# Patient Record
Sex: Male | Born: 1970 | Race: White | Hispanic: No | Marital: Single | Smoking: Never smoker
Health system: Southern US, Community
[De-identification: ages and names within clinical notes are randomized; demographics above are authoritative.]

## PROBLEM LIST (undated history)

## (undated) DIAGNOSIS — K449 Diaphragmatic hernia without obstruction or gangrene: Secondary | ICD-10-CM

## (undated) DIAGNOSIS — F419 Anxiety disorder, unspecified: Secondary | ICD-10-CM

## (undated) DIAGNOSIS — N200 Calculus of kidney: Secondary | ICD-10-CM

## (undated) DIAGNOSIS — K219 Gastro-esophageal reflux disease without esophagitis: Secondary | ICD-10-CM

## (undated) HISTORY — DX: Gastro-esophageal reflux disease without esophagitis: K21.9

## (undated) HISTORY — DX: Anxiety disorder, unspecified: F41.9

## (undated) HISTORY — DX: Calculus of kidney: N20.0

## (undated) HISTORY — DX: Diaphragmatic hernia without obstruction or gangrene: K44.9

---

## 2000-11-19 ENCOUNTER — Emergency Department (HOSPITAL_COMMUNITY): Admission: EM | Admit: 2000-11-19 | Discharge: 2000-11-20 | Payer: Self-pay | Admitting: *Deleted

## 2001-05-05 ENCOUNTER — Encounter: Payer: Self-pay | Admitting: Emergency Medicine

## 2001-05-05 ENCOUNTER — Emergency Department (HOSPITAL_COMMUNITY): Admission: EM | Admit: 2001-05-05 | Discharge: 2001-05-05 | Payer: Self-pay | Admitting: Emergency Medicine

## 2001-05-07 ENCOUNTER — Ambulatory Visit (HOSPITAL_COMMUNITY): Admission: RE | Admit: 2001-05-07 | Discharge: 2001-05-07 | Payer: Self-pay | Admitting: Family Medicine

## 2005-02-16 ENCOUNTER — Emergency Department (HOSPITAL_COMMUNITY): Admission: EM | Admit: 2005-02-16 | Discharge: 2005-02-16 | Payer: Self-pay | Admitting: Family Medicine

## 2005-05-02 ENCOUNTER — Emergency Department (HOSPITAL_COMMUNITY): Admission: EM | Admit: 2005-05-02 | Discharge: 2005-05-02 | Payer: Self-pay | Admitting: Family Medicine

## 2005-11-15 ENCOUNTER — Emergency Department (HOSPITAL_COMMUNITY): Admission: EM | Admit: 2005-11-15 | Discharge: 2005-11-15 | Payer: Self-pay | Admitting: Emergency Medicine

## 2006-06-01 ENCOUNTER — Ambulatory Visit (HOSPITAL_COMMUNITY): Admission: RE | Admit: 2006-06-01 | Discharge: 2006-06-01 | Payer: Self-pay | Admitting: Urology

## 2007-08-21 ENCOUNTER — Emergency Department (HOSPITAL_COMMUNITY): Admission: EM | Admit: 2007-08-21 | Discharge: 2007-08-21 | Payer: Self-pay | Admitting: Emergency Medicine

## 2007-09-05 ENCOUNTER — Emergency Department (HOSPITAL_COMMUNITY): Admission: EM | Admit: 2007-09-05 | Discharge: 2007-09-05 | Payer: Self-pay | Admitting: Family Medicine

## 2008-11-09 IMAGING — CR DG ABDOMEN 1V
1 series · 1 of 1 positions shown · non-contrast
Comparison: None.

CLINICAL DATA: Pre-op, left-sided pain, left kidney stone.  
 ABDOMEN ? 1 VIEW:

[t abdomen supine]
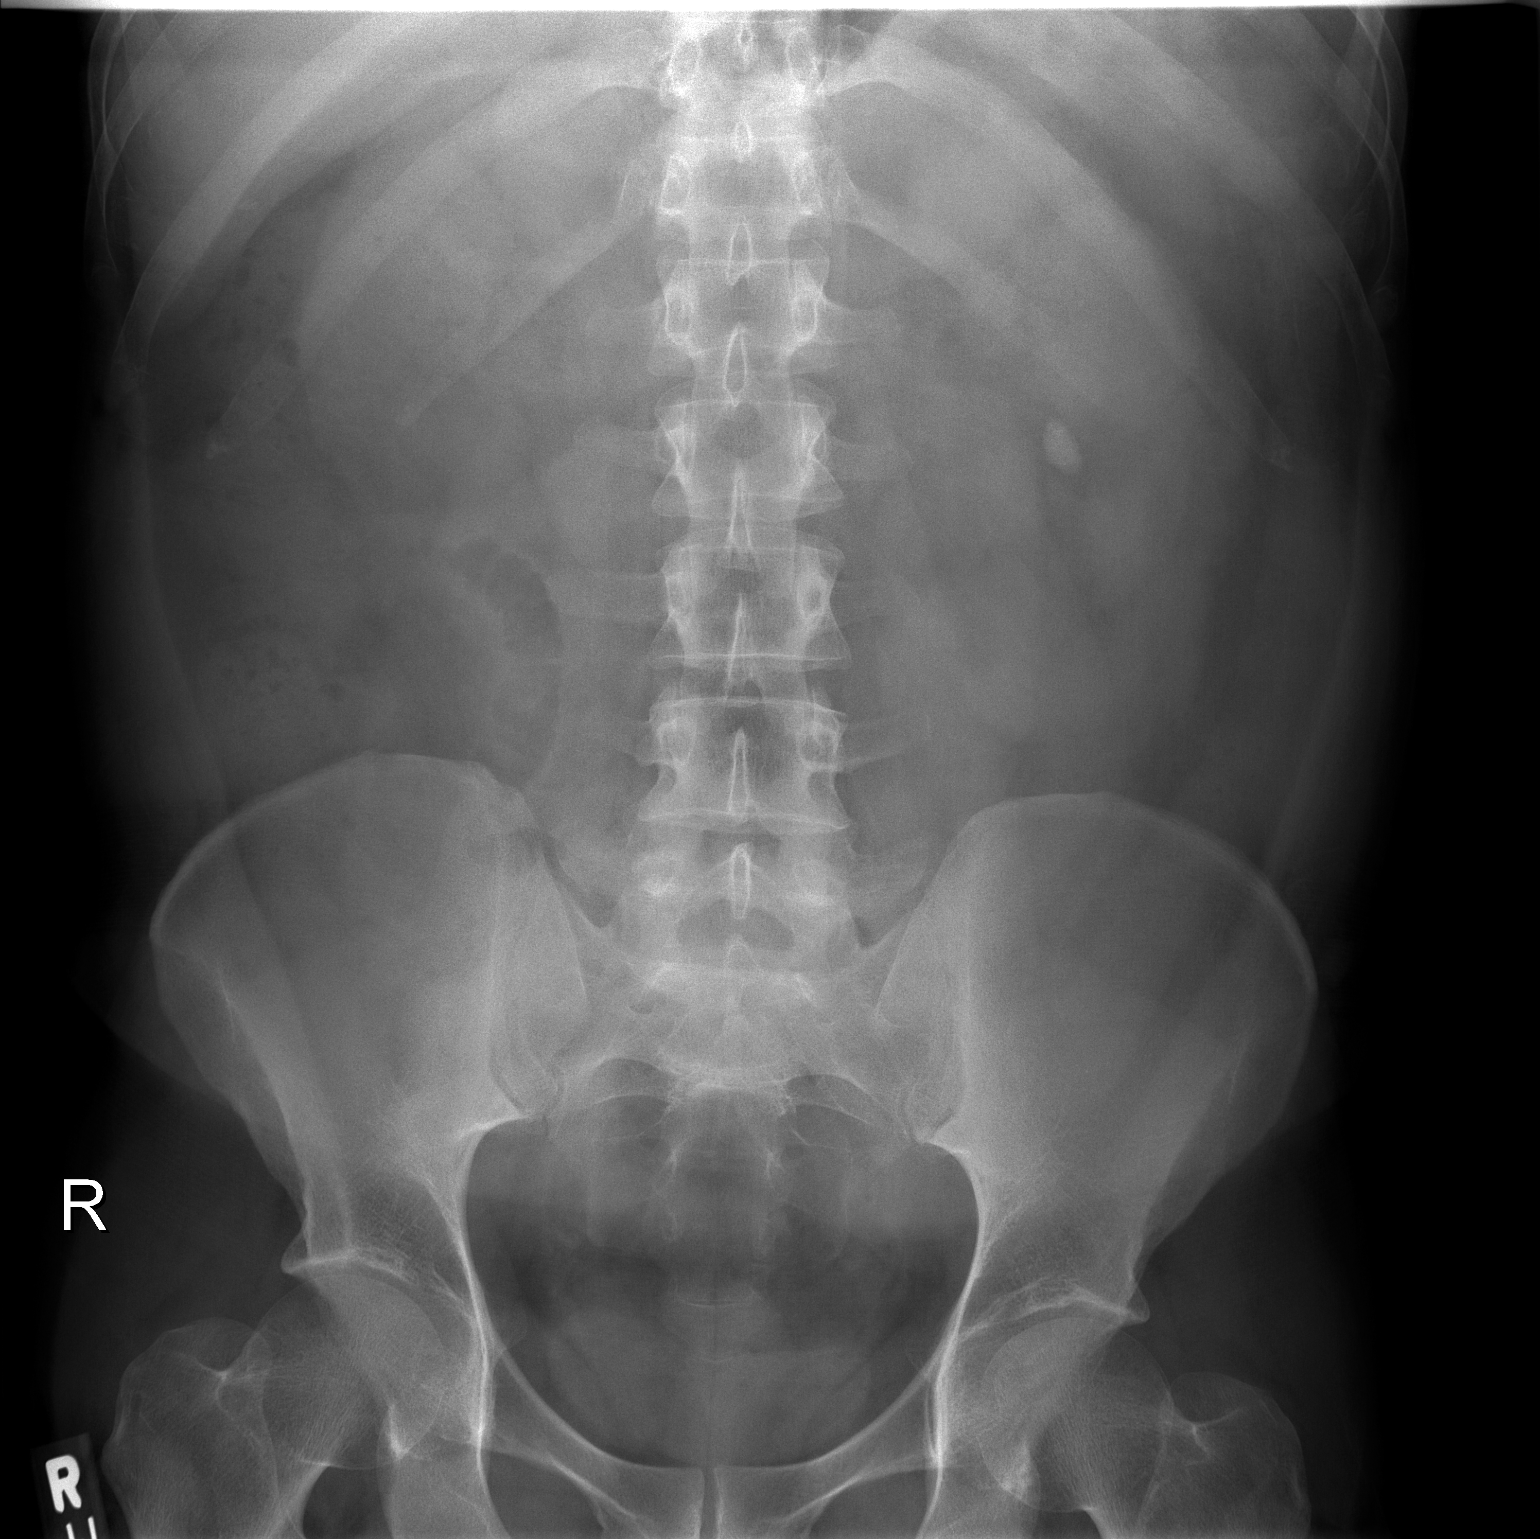

[1 of 1 positions shown; findings below may reference images not displayed]

FINDINGS: 17 x 9 mm mid to lower pole left renal calculus is seen.  Remaining abdominal radiograph appears normal.  No other urinary tract calcifications are noted specifically.
IMPRESSION: 1.  17 x 9 mm mid to lower pole left renal calculus.  
 2.  Otherwise negative.

## 2010-12-09 ENCOUNTER — Ambulatory Visit (HOSPITAL_COMMUNITY)
Admission: RE | Admit: 2010-12-09 | Discharge: 2010-12-09 | Disposition: A | Discharge status: Home / Self Care | Payer: 59 | Source: Ambulatory Visit | Attending: Urology | Admitting: Urology

## 2010-12-09 DIAGNOSIS — E7800 Pure hypercholesterolemia, unspecified: Secondary | ICD-10-CM | POA: Insufficient documentation

## 2010-12-09 DIAGNOSIS — N201 Calculus of ureter: Secondary | ICD-10-CM | POA: Insufficient documentation

## 2010-12-09 DIAGNOSIS — K219 Gastro-esophageal reflux disease without esophagitis: Secondary | ICD-10-CM | POA: Insufficient documentation

## 2010-12-09 LAB — CBC
HCT: 42.9 % (ref 39.0–52.0)
Hemoglobin: 15.1 g/dL (ref 13.0–17.0)
MCH: 31.5 pg (ref 26.0–34.0)
MCHC: 35.2 g/dL (ref 30.0–36.0)
MCV: 89.6 fL (ref 78.0–100.0)
Platelets: 256 10*3/uL (ref 150–400)
RBC: 4.79 MIL/uL (ref 4.22–5.81)
RDW: 12.1 % (ref 11.5–15.5)
WBC: 9.2 10*3/uL (ref 4.0–10.5)

## 2010-12-09 LAB — SURGICAL PCR SCREEN
MRSA, PCR: NEGATIVE
Staphylococcus aureus: NEGATIVE

## 2010-12-15 NOTE — Op Note (Signed)
NAME:  Adam [name], Adam [name]                  ACCOUNT NO.:  [id number]  MEDICAL RECORD NO.:  [id number]  LOCATION:  DAYL                         FACILITY:  [facility] [place]  PHYSICIAN:  Excell Seltzer. Annabell Howells, M.D.    DATE OF BIRTH:  June 15, 1970  DATE OF PROCEDURE:  12/09/2010 DATE OF DISCHARGE:  12/09/2010                              OPERATIVE REPORT   PROCEDURE:  Cystoscopy, right retrograde pyelogram with interpretation, and right ureteroscopic stone extraction.  PREOPERATIVE DIAGNOSIS:  Right distal stone.  POSTOPERATIVE DIAGNOSIS:  Right distal stone.  SURGEON:  Excell Seltzer. Annabell Howells, MD  ANESTHESIA:  General.  SPECIMEN:  Stone fragments.  DRAINS:  None.  COMPLICATIONS:  None.  INDICATIONS:  [name] is a 40 year old white male with a long history of urolithiasis.  He has a 6 mm right distal stone with persistent symptoms.  He has elected ureteroscopy.  FINDINGS OF PROCEDURE:  He was taken to the operating room where he was given Cipro.  A general anesthetic was induced.  He was placed in lithotomy position.  His perineum and genitalia were prepped with Betadine solution.  He was draped in the usual sterile fashion. Cystoscopy was performed using a 22-French scope and 12-degree lens. Examination revealed a normal urethra.  The external sphincter was intact.  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  Examination of the bladder revealed smooth wall without tumor, stones or inflammation.  Ureteral orifices were unremarkable.  The guidewire was passed up the right ureteral orifice and bypassed the stone, which was visible on fluoro in the distal ureter without difficulty.  The cystoscope was removed and a 12-French introducer sheath and a core dilator was passed over the wire to the mid ureter without difficulty. I could feel the stone right against the dilator.  The dilator was removed and a 6-French short ureteroscope was inserted. Upon insertion of the ureteroscope,  stone fragments were noted at the ureteral meatus which effluxed into the bladder.  I then advanced the scope up the ureter but was unable to see any additional stones.  However, the fragment that effluxed did not appear to be consistent with the size noted on CT scan.  I then reinserted the cystoscope alongside the wire and passed a 5- [place] open-end catheter along the wire and instilled contrast.  This demonstrated some filling defects in the upper distal and mid ureter consistent with stone fragments.  Ureteroscope was then reinserted and stone fragments were identified. Three additional stone fragments were then removed with a nitinol basket without difficulty.  At this point, contrast was reinstilled through the ureteroscope with no additional filling defects noted.  The ureteroscope was removed.  The cystoscope was reinserted over the wire.  The wire was removed.  I did not feel a stent was indicated.  The bladder was drained.  The stone fragments were retrieved.  The patient was given a B and O suppository.  He was taken down from lithotomy position.  He was given IV Tylenol and his anesthetic was reversed.  He was moved to the recovery room in stable condition.  There were no evident complications.     Excell Seltzer. Annabell Howells, M.D.  JJW/MEDQ  D:  12/09/2010  T:  12/09/2010  Job:  [id number]  Electronically Signed by Bjorn Pippin M.D. on 12/15/2010 01:24:46 PM

## 2010-12-23 ENCOUNTER — Ambulatory Visit (HOSPITAL_BASED_OUTPATIENT_CLINIC_OR_DEPARTMENT_OTHER)
Admission: RE | Admit: 2010-12-23 | Discharge: 2010-12-23 | Disposition: A | Discharge status: Home / Self Care | Payer: 59 | Source: Ambulatory Visit | Attending: Urology | Admitting: Urology

## 2010-12-23 ENCOUNTER — Ambulatory Visit (HOSPITAL_COMMUNITY)
Admission: RE | Admit: 2010-12-23 | Discharge: 2010-12-23 | Disposition: A | Discharge status: Home / Self Care | Payer: 59 | Source: Ambulatory Visit | Attending: Urology | Admitting: Urology

## 2010-12-23 DIAGNOSIS — Z79899 Other long term (current) drug therapy: Secondary | ICD-10-CM | POA: Insufficient documentation

## 2010-12-23 DIAGNOSIS — Z01818 Encounter for other preprocedural examination: Secondary | ICD-10-CM | POA: Insufficient documentation

## 2010-12-23 DIAGNOSIS — N201 Calculus of ureter: Secondary | ICD-10-CM | POA: Insufficient documentation

## 2010-12-23 DIAGNOSIS — E7800 Pure hypercholesterolemia, unspecified: Secondary | ICD-10-CM | POA: Insufficient documentation

## 2010-12-30 ENCOUNTER — Ambulatory Visit (HOSPITAL_COMMUNITY): Admission: RE | Admit: 2010-12-30 | Payer: 59 | Source: Ambulatory Visit

## 2011-01-03 ENCOUNTER — Ambulatory Visit (HOSPITAL_COMMUNITY)
Admission: RE | Admit: 2011-01-03 | Discharge: 2011-01-03 | Disposition: A | Discharge status: Home / Self Care | Payer: 59 | Source: Ambulatory Visit | Attending: Urology | Admitting: Urology

## 2011-01-03 DIAGNOSIS — N201 Calculus of ureter: Secondary | ICD-10-CM | POA: Insufficient documentation

## 2011-01-03 DIAGNOSIS — E7800 Pure hypercholesterolemia, unspecified: Secondary | ICD-10-CM | POA: Insufficient documentation

## 2011-01-05 LAB — POCT RAPID STREP A
Streptococcus, Group A Screen (Direct): POSITIVE — AB
Streptococcus, Group A Screen (Direct): NEGATIVE

## 2011-01-13 NOTE — Op Note (Addendum)
  NAME:  Adam [name], Adam [name]                  ACCOUNT NO.:  [id number]  MEDICAL RECORD NO.:  [id number]  LOCATION:                               FACILITY:  [facility]  PHYSICIAN:  Excell Seltzer. Annabell Howells, M.D.    DATE OF BIRTH:  Aug 26, 1970  DATE OF PROCEDURE: DATE OF DISCHARGE:                              OPERATIVE REPORT   PROCEDURE:  Left ureteroscopic stone extraction.  PREOPERATIVE DIAGNOSIS:  Left ureterovesical junction stone.  POSTOPERATIVE DIAGNOSIS:  Left ureterovesical junction stone.  SURGEON:  Excell Seltzer. Annabell Howells, M.D.  ANESTHESIA:  General.  SPECIMENS:  None.  DRAINS:  None.  COMPLICATIONS:  None.  INDICATIONS:  [name] is a 40 year old white male with recurrent urolithiasis, has 3.5 X 4.5 mm left distal ureteral stone with symptoms. He has elected ureteroscopy.  FINDINGS OF PROCEDURE:  He was taken to the operating room where general anesthetic was induced.  He was placed in lithotomy position.  His perineum and genitalia were prepped with Betadine solution.  He was draped in usual sterile fashion.  He had been given Cipro preoperatively.  Ureteroscopy was performed using the 6-French short ureteroscope. Examination revealed a normal urethra.  The prostate was short without obstruction.  The bladder had been inspected a couple of weeks ago. Cystoscopy, the left ureteral orifice was identified.  I was able to pass the scope up the orifice without difficulty to the level of the stone.  The stone was pushed a bit proximally, allowing the ureteroscope to dilate the ureter, and a nitinol basket was then passed through the ureteroscope.  The stone was grasped, was removed without difficulty.  A red rubber catheter was placed and the bladder was drained.  A B and O suppository was placed.  The patient was taken down from lithotomy position.  His anesthetic was reversed.  He was moved to recovery room in stable condition.  There were no complications.  He has left lower pole renal stones and  has almost scheduled for lithotripsy for the stones a week after next.     Excell Seltzer. Annabell Howells, M.D.     JJW/MEDQ  D:  12/23/2010  T:  12/23/2010  Job:  [id number]  Electronically Signed by Bjorn Pippin M.D. on 01/14/2011 01:02:34 PM

## 2013-06-13 IMAGING — CR DG ABDOMEN 1V
1 series · 1 of 1 positions shown · non-contrast
Comparison: 12/23/2010

CLINICAL DATA: Lithotripsy

ABDOMEN - 1 VIEW

[t abdomen supine]
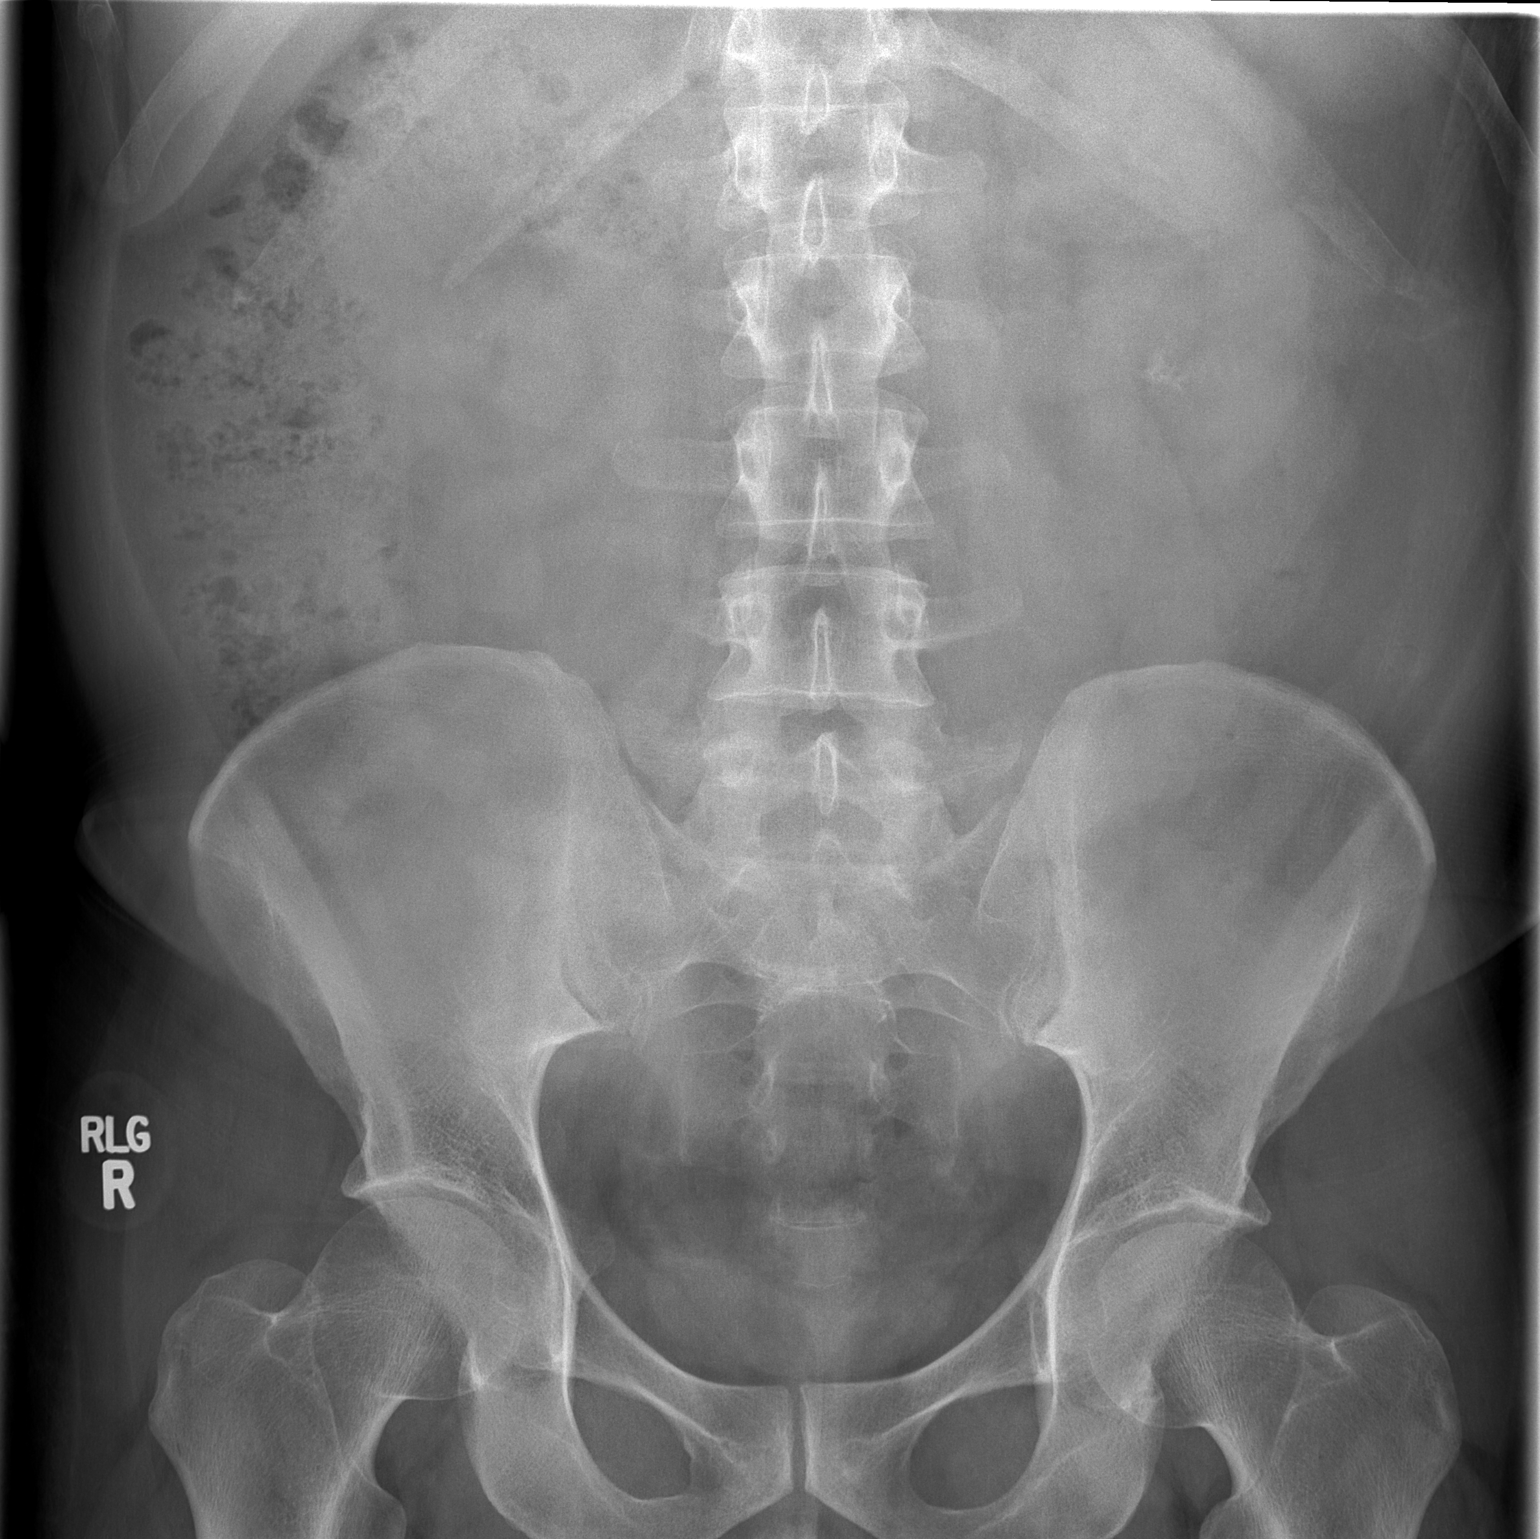

[1 of 1 positions shown; findings below may reference images not displayed]

FINDINGS: Bilateral nephrolithiasis is stable.  Stool in the
ascending and proximal transverse colon is stable.  No
disproportionate dilatation of bowel.  Unremarkable bony framework.
IMPRESSION: Bilateral nephrolithiasis.

## 2019-05-28 ENCOUNTER — Encounter: Payer: Self-pay | Admitting: Family Medicine

## 2019-05-28 ENCOUNTER — Ambulatory Visit: Payer: Self-pay | Admitting: Family Medicine

## 2019-05-28 ENCOUNTER — Other Ambulatory Visit: Payer: Self-pay | Admitting: Family Medicine

## 2019-05-28 VITALS — BP 142/96 | HR 98 | Ht 67.5 in | Wt 188.0 lb

## 2019-05-28 DIAGNOSIS — Z789 Other specified health status: Secondary | ICD-10-CM

## 2019-05-28 NOTE — Progress Notes (Signed)
Subjective:     Patient ID: Adam [name], male   DOB: [birth year], 49 y.o.   MRN: [id number]  HPI [name] for his required wellness visit for his insurance. His PCP is Dr. Marisue Humble, last seen 3 years ago. He has a hx of hiatal hernia and small ulcers, white coat syndrome, kidney stones, anxiety, and GERD. Family hx of right sided colon cancer. He had colonoscopy and EGD at age 25. Sees Dr. Watt Climes. Needs to schedule repeat for f/u. He reports hx of being on a statin, but was d/c'd d/t elevated liver enzymes. He reports his home BP is usually 110-120/70-80.   Past Medical History:  Diagnosis Date  . Anxiety   . GERD (gastroesophageal reflux disease)   . Hiatal hernia   . Kidney stones    Allergies  Allergen Reactions  . Ivp Dye [Iodinated Diagnostic Agents] Hives and Swelling    Current Outpatient Medications:  .  loperamide (IMODIUM) 2 MG capsule, Take by mouth as needed for diarrhea or loose stools., Disp: , Rfl:  .  loratadine (CLARITIN) 10 MG tablet, Take 10 mg by mouth daily., Disp: , Rfl:  .  naproxen sodium (ALEVE) 220 MG tablet, Take 220 mg by mouth daily as needed., Disp: , Rfl:  .  omeprazole (PRILOSEC) 20 MG capsule, Take 20 mg by mouth daily., Disp: , Rfl:    Review of Systems  Constitutional: Negative for chills, fatigue, fever and unexpected weight change.  HENT: Negative for congestion, ear pain, sinus pressure, sinus pain and sore throat.   Eyes: Negative for discharge and visual disturbance.  Respiratory: Negative for cough, shortness of breath and wheezing.   Cardiovascular: Negative for chest pain and leg swelling.  Gastrointestinal: Negative for abdominal pain, blood in stool, constipation, diarrhea, nausea and vomiting.  Genitourinary: Negative for difficulty urinating and hematuria.  Skin: Negative for color change.  Neurological: Negative for dizziness, weakness, light-headedness and headaches.  Hematological: Negative  for adenopathy.  All other systems reviewed and are negative.      Objective:   Physical Exam Vitals reviewed.  Constitutional:      General: He is not in acute distress.    Appearance: He is well-developed.  HENT:     Head: Normocephalic and atraumatic.  Cardiovascular:     Rate and Rhythm: Normal rate and regular rhythm.     Heart sounds: Normal heart sounds. No murmur.  Pulmonary:     Effort: Pulmonary effort is normal. No respiratory distress.     Breath sounds: Normal breath sounds.  Musculoskeletal:     Cervical back: Neck supple.  Skin:    General: Skin is warm and dry.  Neurological:     Mental Status: He is alert and oriented to person, place, and time.  Psychiatric:        Mood and Affect: Mood normal.        Behavior: Behavior normal.    Today's Vitals   05/28/19 1604  BP: (!) 142/96  Pulse: 98  SpO2: 98%  Weight: 188 lb (85.3 kg)  Height: 5' 7.5" (1.715 m)   Body mass index is 29.01 kg/m.     Assessment:     Participant in health and wellness plan      Plan:     1. Encouraged f/u with PCP for annual physical. 2. Lab work drawn, will check lipid panel, cmp, and psa. Will notify pt of results.  3. Encouraged healthy diet and exercise.  4. F/u here prn.

## 2019-05-29 LAB — COMPLETE METABOLIC PANEL WITH GFR
AG Ratio: 1.3 (calc) (ref 1.0–2.5)
ALT: 39 U/L (ref 9–46)
AST: 32 U/L (ref 10–35)
Albumin: 4.3 g/dL (ref 3.6–5.1)
Alkaline phosphatase (APISO): 71 U/L (ref 35–144)
BUN: 15 mg/dL (ref 7–25)
CO2: 26 mmol/L (ref 20–32)
Calcium: 9.4 mg/dL (ref 8.6–10.3)
Chloride: 104 mmol/L (ref 98–110)
Creat: 0.84 mg/dL (ref 0.70–1.33)
GFR, Est African American: 118 mL/min/{1.73_m2} (ref >=60)
GFR, Est Non African American: 102 mL/min/{1.73_m2} (ref >=60)
Globulin: 3.2 g/dL (calc) (ref 1.9–3.7)
Glucose, Bld: 121 mg/dL (ref 65–139)
Potassium: 4.4 mmol/L (ref 3.5–5.3)
Sodium: 140 mmol/L (ref 135–146)
Total Bilirubin: 0.5 mg/dL (ref 0.2–1.2)
Total Protein: 7.5 g/dL (ref 6.1–8.1)

## 2019-05-29 LAB — LIPID PANEL
Cholesterol: 240 mg/dL — ABNORMAL HIGH (ref <200)
HDL: 42 mg/dL (ref >=40)
LDL Cholesterol (Calc): 151 mg/dL (calc) — ABNORMAL HIGH
Non-HDL Cholesterol (Calc): 198 mg/dL (calc) — ABNORMAL HIGH (ref <130)
Total CHOL/HDL Ratio: 5.7 (calc) — ABNORMAL HIGH (ref <5.0)
Triglycerides: 308 mg/dL — ABNORMAL HIGH (ref <150)

## 2019-05-29 LAB — PSA: PSA: 1 ng/mL (ref <=4.0)

## 2021-05-03 ENCOUNTER — Telehealth: Payer: Self-pay

## 2021-05-03 NOTE — Telephone Encounter (Signed)
Called pt, LVM to schedule NP OV.

## 2021-05-03 NOTE — Telephone Encounter (Signed)
-----   Message from Etta Grandchild, MD sent at 05/03/2021  7:38 AM EST ----- Regarding: CPX Maleeka Sabatino -  Will call him and get him on my schedule for a CPX? Friday if needed.  TH

## 2021-06-02 ENCOUNTER — Encounter: Payer: Self-pay | Admitting: Internal Medicine

## 2021-06-02 ENCOUNTER — Other Ambulatory Visit: Payer: Self-pay

## 2021-06-02 ENCOUNTER — Ambulatory Visit (INDEPENDENT_AMBULATORY_CARE_PROVIDER_SITE_OTHER): Payer: No Typology Code available for payment source | Admitting: Internal Medicine

## 2021-06-02 VITALS — BP 156/96 | HR 96 | Temp 98.2°F | Ht 67.5 in | Wt 196.0 lb

## 2021-06-02 DIAGNOSIS — N200 Calculus of kidney: Secondary | ICD-10-CM | POA: Diagnosis not present

## 2021-06-02 DIAGNOSIS — R0683 Snoring: Secondary | ICD-10-CM | POA: Diagnosis not present

## 2021-06-02 DIAGNOSIS — I10 Essential (primary) hypertension: Secondary | ICD-10-CM | POA: Diagnosis not present

## 2021-06-02 DIAGNOSIS — Z1159 Encounter for screening for other viral diseases: Secondary | ICD-10-CM | POA: Insufficient documentation

## 2021-06-02 DIAGNOSIS — Z23 Encounter for immunization: Secondary | ICD-10-CM

## 2021-06-02 DIAGNOSIS — Z0000 Encounter for general adult medical examination without abnormal findings: Secondary | ICD-10-CM

## 2021-06-02 DIAGNOSIS — R3121 Asymptomatic microscopic hematuria: Secondary | ICD-10-CM

## 2021-06-02 DIAGNOSIS — Z1211 Encounter for screening for malignant neoplasm of colon: Secondary | ICD-10-CM | POA: Insufficient documentation

## 2021-06-02 DIAGNOSIS — Z0001 Encounter for general adult medical examination with abnormal findings: Secondary | ICD-10-CM

## 2021-06-02 DIAGNOSIS — K580 Irritable bowel syndrome with diarrhea: Secondary | ICD-10-CM

## 2021-06-02 LAB — BASIC METABOLIC PANEL
BUN: 15 mg/dL (ref 6–23)
CO2: 33 mEq/L — ABNORMAL HIGH (ref 19–32)
Calcium: 9 mg/dL (ref 8.4–10.5)
Chloride: 105 mEq/L (ref 96–112)
Creatinine, Ser: 1.11 mg/dL (ref 0.40–1.50)
GFR: 76.11 mL/min (ref >60.00)
Glucose, Bld: 100 mg/dL — ABNORMAL HIGH (ref 70–99)
Potassium: 3.7 mEq/L (ref 3.5–5.1)
Sodium: 142 mEq/L (ref 135–145)

## 2021-06-02 LAB — HEPATIC FUNCTION PANEL
ALT: 25 U/L (ref 0–53)
AST: 20 U/L (ref 0–37)
Albumin: 4.3 g/dL (ref 3.5–5.2)
Alkaline Phosphatase: 67 U/L (ref 39–117)
Bilirubin, Direct: 0.1 mg/dL (ref 0.0–0.3)
Total Bilirubin: 0.4 mg/dL (ref 0.2–1.2)
Total Protein: 7.1 g/dL (ref 6.0–8.3)

## 2021-06-02 LAB — LIPID PANEL
Cholesterol: 225 mg/dL — ABNORMAL HIGH (ref 0–200)
HDL: 42.4 mg/dL (ref >39.00)
NonHDL: 182.85
Total CHOL/HDL Ratio: 5
Triglycerides: 335 mg/dL — ABNORMAL HIGH (ref 0.0–149.0)
VLDL: 67 mg/dL — ABNORMAL HIGH (ref 0.0–40.0)

## 2021-06-02 LAB — TSH: TSH: 1.64 u[IU]/mL (ref 0.35–5.50)

## 2021-06-02 LAB — CBC WITH DIFFERENTIAL/PLATELET
Basophils Absolute: 0 10*3/uL (ref 0.0–0.1)
Basophils Relative: 0.8 % (ref 0.0–3.0)
Eosinophils Absolute: 0.1 10*3/uL (ref 0.0–0.7)
Eosinophils Relative: 2.3 % (ref 0.0–5.0)
HCT: 41.3 % (ref 39.0–52.0)
Hemoglobin: 14 g/dL (ref 13.0–17.0)
Lymphocytes Relative: 31.5 % (ref 12.0–46.0)
Lymphs Abs: 2 10*3/uL (ref 0.7–4.0)
MCHC: 33.9 g/dL (ref 30.0–36.0)
MCV: 88.5 fl (ref 78.0–100.0)
Monocytes Absolute: 0.5 10*3/uL (ref 0.1–1.0)
Monocytes Relative: 8.4 % (ref 3.0–12.0)
Neutro Abs: 3.7 10*3/uL (ref 1.4–7.7)
Neutrophils Relative %: 57 % (ref 43.0–77.0)
Platelets: 218 10*3/uL (ref 150.0–400.0)
RBC: 4.67 Mil/uL (ref 4.22–5.81)
RDW: 13.1 % (ref 11.5–15.5)
WBC: 6.5 10*3/uL (ref 4.0–10.5)

## 2021-06-02 LAB — LDL CHOLESTEROL, DIRECT: Direct LDL: 150 mg/dL

## 2021-06-02 LAB — PSA: PSA: 0.79 ng/mL (ref 0.10–4.00)

## 2021-06-02 NOTE — Patient Instructions (Signed)

## 2021-06-02 NOTE — Progress Notes (Signed)
Subjective:  Patient ID: Adam [name], male    DOB: 04/07/1971  Age: 51 y.o. MRN: [id number]  CC: Annual Exam and Hypertension  This visit occurred during the SARS-CoV-2 public health emergency.  Safety protocols were in place, including screening questions prior to the visit, additional usage of staff PPE, and extensive cleaning of exam room while observing appropriate contact time as indicated for disinfecting solutions.    HPI Adam [name] presents for a CPX and to establish.  He has untreated hypertension.  He complains of intermittent headaches but he denies blurred vision, chest pain, shortness of breath, dyspnea on exertion, edema, or fatigue.  He has difficulty falling asleep, nocturia, and has had complaints about loud snoring.  He has chronic diarrhea caused by IBS-D.  He denies abdominal pain, loss of appetite, weight loss, melena, or bright red blood per rectum.  History Adam [name] has a past medical history of Anxiety, GERD (gastroesophageal reflux disease), Hiatal hernia, and Kidney stones.   He has no past surgical history on file.   His family history includes Hypertension in his father.He reports that he has never smoked. He has never used smokeless tobacco. He reports current alcohol use of about 3.0 standard drinks per week. He reports that he does not use drugs.  Outpatient Medications Prior to Visit  Medication Sig Dispense Refill   loperamide (IMODIUM) 2 MG capsule Take by mouth as needed for diarrhea or loose stools.     loratadine (CLARITIN) 10 MG tablet Take 10 mg by mouth daily.     naproxen sodium (ALEVE) 220 MG tablet Take 220 mg by mouth daily as needed.     omeprazole (PRILOSEC) 20 MG capsule Take 20 mg by mouth daily.     No facility-administered medications prior to visit.    ROS Review of Systems  Constitutional:  Positive for unexpected weight change. Negative for appetite change, chills, diaphoresis and fatigue.  HENT: Negative.    Eyes: Negative.    Respiratory:  Negative for cough, chest tightness, shortness of breath and wheezing.   Cardiovascular:  Negative for chest pain, palpitations and leg swelling.  Gastrointestinal:  Positive for diarrhea. Negative for abdominal pain, anal bleeding, blood in stool, constipation, nausea, rectal pain and vomiting.  Endocrine: Negative.   Genitourinary: Negative.  Negative for difficulty urinating, dysuria, penile discharge, scrotal swelling and testicular pain.  Musculoskeletal: Negative.   Skin: Negative.  Negative for color change.  Neurological:  Positive for headaches. Negative for dizziness, tremors, seizures, syncope, weakness and light-headedness.  Hematological:  Negative for adenopathy. Does not bruise/bleed easily.  Psychiatric/Behavioral: Negative.     Objective:  BP (!) 156/96    Pulse 96    Temp 98.2 F (36.8 C) (Oral)    Ht 5' 7.5" (1.715 m)    Wt 196 lb (88.9 kg)    SpO2 97%    BMI 30.24 kg/m   Physical Exam Vitals reviewed.  Constitutional:      Appearance: Normal appearance.  HENT:     Nose: Nose normal.     Mouth/Throat:     Mouth: Mucous membranes are moist.  Eyes:     General: No scleral icterus.    Conjunctiva/sclera: Conjunctivae normal.  Cardiovascular:     Rate and Rhythm: Normal rate and regular rhythm.     Heart sounds: Normal heart sounds, S1 normal and S2 normal. No murmur heard.   No gallop.     Comments: EKG- NSR, 83 bpm No LVH Normal EKG Pulmonary:  Effort: Pulmonary effort is normal.     Breath sounds: No stridor. No wheezing, rhonchi or rales.  Abdominal:     General: Abdomen is protuberant. Bowel sounds are normal. There is no distension.     Palpations: There is no hepatomegaly, splenomegaly or mass.     Tenderness: There is no abdominal tenderness. There is no guarding.     Hernia: There is no hernia in the left inguinal area or right inguinal area.  Genitourinary:    Pubic Area: No rash.      Penis: Normal and circumcised.       Testes: Normal.     Epididymis:     Right: Normal.     Left: Normal.     Prostate: Normal. Not enlarged, not tender and no nodules present.     Rectum: Normal. Guaiac result negative. No mass, tenderness, anal fissure, external hemorrhoid or internal hemorrhoid. Normal anal tone.  Musculoskeletal:        General: Normal range of motion.     Cervical back: Neck supple.     Right lower leg: No edema.     Left lower leg: No edema.  Lymphadenopathy:     Cervical: No cervical adenopathy.     Lower Body: No right inguinal adenopathy. No left inguinal adenopathy.  Skin:    General: Skin is warm and dry.  Neurological:     General: No focal deficit present.     Mental Status: He is alert. Mental status is at baseline.  Psychiatric:        Mood and Affect: Mood normal.        Behavior: Behavior normal.    Lab Results  Component Value Date   WBC 6.5 06/02/2021   HGB 14.0 06/02/2021   HCT 41.3 06/02/2021   PLT 218.0 06/02/2021   GLUCOSE 100 (H) 06/02/2021   CHOL 225 (H) 06/02/2021   TRIG 335.0 (H) 06/02/2021   HDL 42.40 06/02/2021   LDLDIRECT 150.0 06/02/2021   LDLCALC 151 (H) 05/28/2019   ALT 25 06/02/2021   AST 20 06/02/2021   NA 142 06/02/2021   K 3.7 06/02/2021   CL 105 06/02/2021   CREATININE 1.11 06/02/2021   BUN 15 06/02/2021   CO2 33 (H) 06/02/2021   TSH 1.64 06/02/2021   PSA 0.79 06/02/2021     Assessment & Plan:   Adam [name] was seen today for annual exam and hypertension.  Diagnoses and all orders for this visit:  Screen for colon cancer -     Ambulatory referral to Gastroenterology  Encounter for general adult medical examination with abnormal findings-exam completed, labs reviewed; statin therapy is not indicated, vaccines reviewed and updated, cancer screenings addressed, patient education was given. -     Lipid panel; Future -     PSA; Future -     Hepatitis C antibody; Future -     HIV Antibody (routine testing w rflx); Future -     HIV Antibody (routine  testing w rflx) -     Hepatitis C antibody -     PSA -     Lipid panel  Primary hypertension-his blood pressure is not adequately well controlled and he is symptomatic.  We will check labs to screen for secondary causes and endorgan damage.  His EKG is reassuring.  In addition to lifestyle modifications he will start taking an ARB. -     TSH; Future -     Urinalysis, Routine w reflex microscopic; Future -  Hepatic function panel; Future -     Basic metabolic panel; Future -     Aldosterone + renin activity w/ ratio; Future -     CBC with Differential/Platelet; Future -     EKG 12-Lead -     CBC with Differential/Platelet -     Aldosterone + renin activity w/ ratio -     Basic metabolic panel -     Hepatic function panel -     Urinalysis, Routine w reflex microscopic -     TSH -     olmesartan (BENICAR) 20 MG tablet; Take 1 tablet (20 mg total) by mouth daily. -     US Renal Artery Stenosis; Future  Need for hepatitis C screening test -     Hepatitis C antibody; Future -     Hepatitis C antibody  Need for shingles vaccine -     Varicella-zoster vaccine IM (Shingrix)  Loud snoring -     Ambulatory referral to Sleep Studies  Asymptomatic microscopic hematuria- He is not willing to undergo a renal CT scan. -     Cancel: CT RENAL STONE STUDY; Future -     US Renal Artery Stenosis; Future  Nephrolithiasis -     Cancel: CT RENAL STONE STUDY; Future -     US Renal Artery Stenosis; Future  Irritable bowel syndrome with diarrhea- He treats this with imodium.  Other orders -     LDL cholesterol, direct   I am having Leny Morozov Broom. Goldberg start on olmesartan. I am also having him maintain his naproxen sodium, loperamide, loratadine, and omeprazole.  Meds ordered this encounter  Medications   olmesartan (BENICAR) 20 MG tablet    Sig: Take 1 tablet (20 mg total) by mouth daily.    Dispense:  90 tablet    Refill:  0     Follow-up: Return in about 3 months (around  08/30/2021).  Scarlette Calico, MD

## 2021-06-03 ENCOUNTER — Encounter: Payer: Self-pay | Admitting: Internal Medicine

## 2021-06-03 DIAGNOSIS — R3121 Asymptomatic microscopic hematuria: Secondary | ICD-10-CM | POA: Insufficient documentation

## 2021-06-03 LAB — URINALYSIS, ROUTINE W REFLEX MICROSCOPIC
Bilirubin Urine: NEGATIVE
Ketones, ur: NEGATIVE
Leukocytes,Ua: NEGATIVE
Nitrite: NEGATIVE
Specific Gravity, Urine: 1.025 (ref 1.000–1.030)
Total Protein, Urine: NEGATIVE
Urine Glucose: NEGATIVE
Urobilinogen, UA: 0.2 (ref 0.0–1.0)
pH: 6 (ref 5.0–8.0)

## 2021-06-03 MED ORDER — OLMESARTAN MEDOXOMIL 20 MG PO TABS: 20.0000 mg | ORAL_TABLET | Freq: Every day | ORAL | 0 refills | Status: DC

## 2021-06-08 ENCOUNTER — Encounter: Payer: Self-pay | Admitting: Internal Medicine

## 2021-06-08 DIAGNOSIS — N200 Calculus of kidney: Secondary | ICD-10-CM | POA: Insufficient documentation

## 2021-06-08 LAB — HEPATITIS C ANTIBODY
Hepatitis C Ab: NONREACTIVE
SIGNAL TO CUT-OFF: 0.08 (ref <1.00)

## 2021-06-08 LAB — HIV ANTIBODY (ROUTINE TESTING W REFLEX): HIV 1&2 Ab, 4th Generation: NONREACTIVE

## 2021-06-08 LAB — ALDOSTERONE + RENIN ACTIVITY W/ RATIO
Aldosterone: <1 ng/dL
Renin Activity: 0.24 ng/mL/h — ABNORMAL LOW (ref 0.25–5.82)

## 2021-08-04 ENCOUNTER — Ambulatory Visit: Payer: No Typology Code available for payment source | Admitting: Neurology

## 2021-08-04 ENCOUNTER — Encounter: Payer: Self-pay | Admitting: Neurology

## 2021-08-04 VITALS — BP 131/84 | HR 96 | Ht 68.0 in | Wt 198.6 lb

## 2021-08-04 DIAGNOSIS — Z820 Family history of epilepsy and other diseases of the nervous system: Secondary | ICD-10-CM

## 2021-08-04 DIAGNOSIS — R0683 Snoring: Secondary | ICD-10-CM

## 2021-08-04 DIAGNOSIS — R519 Headache, unspecified: Secondary | ICD-10-CM | POA: Diagnosis not present

## 2021-08-04 DIAGNOSIS — G4719 Other hypersomnia: Secondary | ICD-10-CM

## 2021-08-04 NOTE — Progress Notes (Signed)
Subjective:  ?  ?Patient ID: Adam Adam [name] Adam [name] a 51 y.o. male. ? ?HPI ? ? ? ?History:  ? ?Dear Dr. Yetta [name],  ? ?I saw your patient, Adam Adam [name], upon your kind request in my sleep clinic today for initial consultation of his sleep disorder, in particular, concern for underlying obstructive sleep apnea.  The patient Adam [name] unaccompanied today.  As you know, Adam Adam [name] Adam [name] a 51 year old right-handed gentleman with an underlying medical history of reflux disease, hiatal hernia, kidney stones, anxiety, irritable bowel syndrome, hypertension and borderline obesity, who reports snoring and excessive daytime somnolence as well as recurrent headaches.  As well as difficulty initiating sleep.  I reviewed your office note from 06/02/2021.  He was started losartan and his blood pressure has improved for the most part and his recurrent headaches have actually improved quite a bit from nearly daily to about twice a week.  He does occasionally wake up with a headache.  He denies night to night nocturia.  His mom has sleep apnea and has a CPAP machine.  He would consider treatment with a CPAP if necessary.  His Epworth sleepiness score Adam [name] 12 out of 24, fatigue severity score Adam [name] 39 out of 63.  He Adam [name] single and lives alone.  He has no children.  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  He does have stress at work.  He works typically from 8:30 AM to 5 PM or thereabouts.  Bedtime Adam [name] generally around 11 PM and rise time around 7 AM.  He does drink quite a bit of caffeine in the form of soda, typically between 4 and 6 cans/day.  He Adam [name] a non-smoker and drinks alcohol occasionally, 2-3 drinks per week.  Weight has been fluctuating, generally within 10 pounds.  ? ?His Past Medical History Adam [name] Significant For: ?Past Medical History:  ?Diagnosis Date  ? Anxiety   ? GERD (gastroesophageal reflux disease)   ? Hiatal hernia   ? Kidney stones   ? ? ?His Past Surgical History Adam [name] Significant For: ?History  reviewed. No pertinent surgical history. ? ?His Family History Adam [name] Significant For: ?Family History  ?Problem Relation Age of Onset  ? Sleep apnea Mother   ? Hypertension Father   ? ? ?His Social History Adam [name] Significant For: ?Social History  ? ?Socioeconomic History  ? Marital status: Single  ?  Spouse name: Not on file  ? Number of children: Not on file  ? Years of education: Not on file  ? Highest education level: Not on file  ?Occupational History  ? Not on file  ?Tobacco Use  ? Smoking status: Never  ? Smokeless tobacco: Never  ?Substance and Sexual Activity  ? Alcohol use: Yes  ?  Alcohol/week: 3.0 standard drinks  ?  Types: 3 Shots of liquor per week  ? Drug use: Never  ? Sexual activity: Not Currently  ?  Partners: Male  ?Other Topics Concern  ? Not on file  ?Social History Narrative  ? Not on file  ? ?Social Determinants of Health  ? ?Financial Resource Strain: Not on file  ?Food Insecurity: Not on file  ?Transportation Needs: Not on file  ?Physical Activity: Not on file  ?Stress: Not on file  ?Social Connections: Not on file  ? ? ?His Allergies Are:  ?Allergies  ?Allergen Reactions  ? Ivp Dye [Iodinated Contrast Media] Hives and Swelling  ?:  ? ?His Current Medications Are:  ?Outpatient Encounter Medications  as of 08/04/2021  ?Medication Sig  ? loperamide (IMODIUM) 2 MG capsule Take by mouth as needed for diarrhea or loose stools. Pt takes 4 tabs daily  ? loratadine (CLARITIN) 10 MG tablet Take 10 mg by mouth daily.  ? Multiple Vitamin (MULTIVITAMIN PO) Take by mouth.  ? naproxen sodium (ALEVE) 220 MG tablet Take 220 mg by mouth daily as needed.  ? olmesartan (BENICAR) 20 MG tablet Take 1 tablet (20 mg total) by mouth daily.  ? omeprazole (PRILOSEC) 20 MG capsule Take 20 mg by mouth daily.  ? ?No facility-administered encounter medications on file as of 08/04/2021.  ?: ? ?Review of Systems:  ?Out of a complete 14 point review of systems, all are reviewed and negative with the exception of these symptoms as  listed below: ? ? ? ? ? ? ?Review of Systems  ?Neurological:   ?     Pt Adam [name] here for sleep consult  Pt states that he snores ,has headaches,fatigue annf hypertension. Pt denies sleep study and CPAP machine  ? ?ESS:12 ?FSS : 39  ? ?Objective:  ?Neurological Exam ? ?Physical Exam ?Physical Examination:  ? ?Vitals:  ? 08/04/21 0913  ?BP: 131/84  ?Pulse: 96  ? ? ?General Examination: The patient Adam [name] a very pleasant 51 y.o. male in no acute distress. He appears well-developed and well-nourished and well groomed.  ? ?HEENT: Normocephalic, atraumatic, pupils are equal, round and reactive to light, extraocular tracking Adam [name] good without limitation to gaze excursion or nystagmus noted. Hearing Adam [name] grossly intact. Face Adam [name] symmetric with normal facial animation. Speech Adam [name] clear with no dysarthria noted. There Adam [name] no hypophonia. There Adam [name] no lip, neck/head, jaw or voice tremor. Neck Adam [name] supple with full range of passive and active motion. There are no carotid bruits on auscultation. Oropharynx exam reveals: mild mouth dryness, good dental hygiene and moderate airway crowding, due to small airway entry and redundant soft palate, uvula on the larger side.  Mallampati class III, tonsillar size of about 1+ bilaterally.  Minimal overbite.  Tongue protrudes centrally and palate elevates symmetrically.  Neck circumference of 17 5/8 inches. ? ?Chest: Clear to auscultation without wheezing, rhonchi or crackles noted. ? ?Heart: S1+S2+0, regular and normal without murmurs, rubs or gallops noted.  ? ?Abdomen: Soft, non-tender and non-distended. ? ?Extremities: There Adam [name] no pitting edema in the distal lower extremities bilaterally.  ? ?Skin: Warm and dry without trophic changes noted.  ? ?Musculoskeletal: exam reveals no obvious joint deformities.  ? ?Neurologically:  ?Mental status: The patient Adam [name] awake, alert and oriented in all 4 spheres. His immediate and remote memory, attention, language skills and fund of knowledge are appropriate. There Adam [name]  no evidence of aphasia, agnosia, apraxia or anomia. Speech Adam [name] clear with normal prosody and enunciation. Thought process Adam [name] linear. Mood Adam [name] normal and affect Adam [name] normal.  ?Cranial nerves II - XII are as described above under HEENT exam.  ?Motor exam: Normal bulk, strength and tone Adam [name] noted. There Adam [name] no obvious tremor. Fine motor skills and coordination: grossly intact.  ?Cerebellar testing: No dysmetria or intention tremor. There Adam [name] no truncal or gait ataxia.  ?Sensory exam: intact to light touch in the upper and lower extremities.  ?Gait, station and balance: He stands easily. No veering to one side Adam [name] noted. No leaning to one side Adam [name] noted. Posture Adam [name] age-appropriate and stance Adam [name] narrow based. Gait shows normal stride length and normal pace. No problems turning are noted.  ? ?Assessment and Plan:  ?In summary,  Adam Adam [name] a very pleasant 51 y.o.-year old male with an underlying medical history of reflux disease, hiatal hernia, kidney stones, anxiety, irritable bowel syndrome, hypertension and borderline obesity, whose history and physical exam are concerning for obstructive sleep apnea (OSA). ?I had a long chat with the patient about my findings and the diagnosis of OSA, its prognosis and treatment options. We talked about medical treatments, surgical interventions and non-pharmacological approaches. I explained in particular the risks and ramifications of untreated moderate to severe OSA, especially with respect to developing cardiovascular disease down the Road, including congestive heart failure, difficult to treat hypertension, cardiac arrhythmias, or stroke. Even type 2 diabetes has, in part, been linked to untreated OSA. Symptoms of untreated OSA include daytime sleepiness, memory problems, mood irritability and mood disorder such as depression and anxiety, lack of energy, as well as recurrent headaches, especially morning headaches. We talked about trying to maintain a healthy lifestyle in general, as  well as the importance of weight control. ?I recommended the following at this time: sleep study. I outlined the differences between laboratory testing versus home sleep testing. ? ?I explained the sleep test p

## 2021-08-04 NOTE — Patient Instructions (Signed)

## 2021-08-17 ENCOUNTER — Telehealth: Payer: Self-pay

## 2021-08-17 NOTE — Telephone Encounter (Signed)
LVM for pt to call me back to schedule sleep study  

## 2021-08-19 ENCOUNTER — Telehealth: Payer: Self-pay

## 2021-08-19 NOTE — Telephone Encounter (Signed)
LVM for pt to call me back to schedule sleep study  

## 2021-08-24 ENCOUNTER — Telehealth: Payer: Self-pay

## 2021-08-24 NOTE — Telephone Encounter (Signed)
Returned pt's call and LVM for pt to call me back to schedule sleep study ° °

## 2021-08-24 NOTE — Telephone Encounter (Signed)
NPSG: no auth req ref # Adam [name]- patient is scheduled at [facility] for 09/16/21 to arrive at 9 PM.  ?

## 2021-08-29 ENCOUNTER — Other Ambulatory Visit: Payer: Self-pay | Admitting: Internal Medicine

## 2021-08-29 DIAGNOSIS — I10 Essential (primary) hypertension: Secondary | ICD-10-CM

## 2021-09-02 ENCOUNTER — Encounter: Payer: Self-pay | Admitting: Internal Medicine

## 2021-09-02 ENCOUNTER — Ambulatory Visit (INDEPENDENT_AMBULATORY_CARE_PROVIDER_SITE_OTHER): Payer: No Typology Code available for payment source | Admitting: Internal Medicine

## 2021-09-02 VITALS — BP 132/88 | HR 90 | Temp 98.4°F | Resp 16 | Ht 68.0 in | Wt 196.0 lb

## 2021-09-02 DIAGNOSIS — I10 Essential (primary) hypertension: Secondary | ICD-10-CM

## 2021-09-02 DIAGNOSIS — R3121 Asymptomatic microscopic hematuria: Secondary | ICD-10-CM

## 2021-09-02 DIAGNOSIS — Z23 Encounter for immunization: Secondary | ICD-10-CM | POA: Diagnosis not present

## 2021-09-02 LAB — URINALYSIS, ROUTINE W REFLEX MICROSCOPIC
Bilirubin Urine: NEGATIVE
Hgb urine dipstick: NEGATIVE
Ketones, ur: NEGATIVE
Leukocytes,Ua: NEGATIVE
Nitrite: NEGATIVE
RBC / HPF: NONE SEEN (ref 0–?)
Specific Gravity, Urine: 1.01 (ref 1.000–1.030)
Total Protein, Urine: NEGATIVE
Urine Glucose: NEGATIVE
Urobilinogen, UA: 0.2 (ref 0.0–1.0)
pH: 6.5 (ref 5.0–8.0)

## 2021-09-02 LAB — BASIC METABOLIC PANEL
BUN: 14 mg/dL (ref 6–23)
CO2: 29 mEq/L (ref 19–32)
Calcium: 9.8 mg/dL (ref 8.4–10.5)
Chloride: 100 mEq/L (ref 96–112)
Creatinine, Ser: 0.95 mg/dL (ref 0.40–1.50)
GFR: 91.58 mL/min (ref >60.00)
Glucose, Bld: 92 mg/dL (ref 70–99)
Potassium: 4 mEq/L (ref 3.5–5.1)
Sodium: 139 mEq/L (ref 135–145)

## 2021-09-02 NOTE — Progress Notes (Signed)
Subjective:  Patient ID: Adam [name], male    DOB: 07/13/1970  Age: 51 y.o. MRN: [id number]  CC: Hypertension   HPI BRAYCEN BURANDT presents for f/up -  He is active and denies chest pain, shortness of breath, diaphoresis, dizziness, lightheadedness, or edema.  Outpatient Medications Prior to Visit  Medication Sig Dispense Refill   loperamide (IMODIUM) 2 MG capsule Take by mouth as needed for diarrhea or loose stools. Pt takes 4 tabs daily     loratadine (CLARITIN) 10 MG tablet Take 10 mg by mouth daily.     Multiple Vitamin (MULTIVITAMIN PO) Take by mouth.     naproxen sodium (ALEVE) 220 MG tablet Take 220 mg by mouth daily as needed.     olmesartan (BENICAR) 20 MG tablet TAKE 1 TABLET BY MOUTH EVERY DAY 30 tablet 2   omeprazole (PRILOSEC) 20 MG capsule Take 20 mg by mouth daily.     No facility-administered medications prior to visit.    ROS Review of Systems  Constitutional:  Negative for diaphoresis and fatigue.  HENT: Negative.    Eyes: Negative.   Respiratory:  Negative for cough and shortness of breath.   Cardiovascular:  Negative for chest pain, palpitations and leg swelling.  Gastrointestinal:  Negative for abdominal pain, constipation, diarrhea, nausea and vomiting.  Endocrine: Negative.   Genitourinary: Negative.  Negative for difficulty urinating and hematuria.  Musculoskeletal: Negative.   Skin: Negative.   Neurological:  Negative for dizziness, weakness, light-headedness and headaches.  Hematological:  Negative for adenopathy. Does not bruise/bleed easily.  Psychiatric/Behavioral: Negative.     Objective:  BP 132/88 (BP Location: Left Arm, Patient Position: Sitting, Cuff Size: Large)   Pulse 90   Temp 98.4 F (36.9 C) (Oral)   Resp 16   Ht 5\' 8"  (1.727 m)   Wt 196 lb (88.9 kg)   SpO2 93%   BMI 29.80 kg/m   BP Readings from Last 3 Encounters:  09/02/21 132/88  08/04/21 131/84  06/02/21 (!) 156/96    Wt Readings from Last 3 Encounters:  09/02/21  196 lb (88.9 kg)  08/04/21 198 lb 9.6 oz (90.1 kg)  06/02/21 196 lb (88.9 kg)    Physical Exam Vitals reviewed.  HENT:     Nose: Nose normal.     Mouth/Throat:     Mouth: Mucous membranes are moist.  Eyes:     General: No scleral icterus.    Conjunctiva/sclera: Conjunctivae normal.  Cardiovascular:     Rate and Rhythm: Normal rate and regular rhythm.     Heart sounds: No murmur heard. Pulmonary:     Effort: Pulmonary effort is normal.     Breath sounds: No stridor. No wheezing, rhonchi or rales.  Abdominal:     General: Abdomen is flat.     Palpations: There is no mass.     Tenderness: There is no abdominal tenderness. There is no guarding.     Hernia: No hernia is present.  Musculoskeletal:        General: Normal range of motion.     Cervical back: Neck supple.     Right lower leg: No edema.     Left lower leg: No edema.  Lymphadenopathy:     Cervical: No cervical adenopathy.  Skin:    General: Skin is warm and dry.  Neurological:     General: No focal deficit present.     Mental Status: He is alert.  Psychiatric:  Mood and Affect: Mood normal.        Behavior: Behavior normal.    Lab Results  Component Value Date   WBC 6.5 06/02/2021   HGB 14.0 06/02/2021   HCT 41.3 06/02/2021   PLT 218.0 06/02/2021   GLUCOSE 92 09/02/2021   CHOL 225 (H) 06/02/2021   TRIG 335.0 (H) 06/02/2021   HDL 42.40 06/02/2021   LDLDIRECT 150.0 06/02/2021   LDLCALC 151 (H) 05/28/2019   ALT 25 06/02/2021   AST 20 06/02/2021   NA 139 09/02/2021   K 4.0 09/02/2021   CL 100 09/02/2021   CREATININE 0.95 09/02/2021   BUN 14 09/02/2021   CO2 29 09/02/2021   TSH 1.64 06/02/2021   PSA 0.79 06/02/2021    DG Abd 1 View  Result Date: 01/03/2011 *RADIOLOGY REPORT* Clinical Data: Lithotripsy ABDOMEN - 1 VIEW Comparison: 12/23/2010 Findings: Bilateral nephrolithiasis is stable.  Stool in the ascending and proximal transverse colon is stable.  No disproportionate dilatation of bowel.   Unremarkable bony framework. IMPRESSION: Bilateral nephrolithiasis. Original Report Authenticated By: Donavan Burnet, M.D.   Assessment & Plan:   Kosei was seen today for hypertension.  Diagnoses and all orders for this visit:  Primary hypertension- His blood pressure is adequately well controlled. -     Basic metabolic panel; Future -     Urinalysis, Routine w reflex microscopic; Future -     Urinalysis, Routine w reflex microscopic -     Basic metabolic panel  Asymptomatic microscopic hematuria- This has resolved. -     Urinalysis, Routine w reflex microscopic; Future -     Urinalysis, Routine w reflex microscopic  Other orders -     Varicella-zoster vaccine IM (Shingrix)   I am having Ervin Knack. Merriwether maintain his naproxen sodium, loperamide, loratadine, omeprazole, Multiple Vitamin (MULTIVITAMIN PO), and olmesartan.  No orders of the defined types were placed in this encounter.    Follow-up: Return in about 6 months (around 03/05/2022).  Sanda Linger, MD

## 2021-09-02 NOTE — Patient Instructions (Signed)
Hypertension, Adult High blood pressure (hypertension) is when the force of blood pumping through the arteries is too strong. The arteries are the blood vessels that carry blood from the heart throughout the body. Hypertension forces the heart to work harder to pump blood and may cause arteries to become narrow or stiff. Untreated or uncontrolled hypertension can lead to a heart attack, heart failure, a stroke, kidney disease, and other problems. A blood pressure reading consists of a higher number over a lower number. Ideally, your blood pressure should be below 120/80. The first ("top") number is called the systolic pressure. It is a measure of the pressure in your arteries as your heart beats. The second ("bottom") number is called the diastolic pressure. It is a measure of the pressure in your arteries as the heart relaxes. What are the causes? The exact cause of this condition is not known. There are some conditions that result in high blood pressure. What increases the risk? Certain factors may make you more likely to develop high blood pressure. Some of these risk factors are under your control, including: Smoking. Not getting enough exercise or physical activity. Being overweight. Having too much fat, sugar, calories, or salt (sodium) in your diet. Drinking too much alcohol. Other risk factors include: Having a personal history of heart disease, diabetes, high cholesterol, or kidney disease. Stress. Having a family history of high blood pressure and high cholesterol. Having obstructive sleep apnea. Age. The risk increases with age. What are the signs or symptoms? High blood pressure may not cause symptoms. Very high blood pressure (hypertensive crisis) may cause: Headache. Fast or irregular heartbeats (palpitations). Shortness of breath. Nosebleed. Nausea and vomiting. Vision changes. Severe chest pain, dizziness, and seizures. How is this diagnosed? This condition is diagnosed by  measuring your blood pressure while you are seated, with your arm resting on a flat surface, your legs uncrossed, and your feet flat on the floor. The cuff of the blood pressure monitor will be placed directly against the skin of your upper arm at the level of your heart. Blood pressure should be measured at least twice using the same arm. Certain conditions can cause a difference in blood pressure between your right and left arms. If you have a high blood pressure reading during one visit or you have normal blood pressure with other risk factors, you may be asked to: Return on a different day to have your blood pressure checked again. Monitor your blood pressure at home for 1 week or longer. If you are diagnosed with hypertension, you may have other blood or imaging tests to help your health care provider understand your overall risk for other conditions. How is this treated? This condition is treated by making healthy lifestyle changes, such as eating healthy foods, exercising more, and reducing your alcohol intake. You may be referred for counseling on a healthy diet and physical activity. Your health care provider may prescribe medicine if lifestyle changes are not enough to get your blood pressure under control and if: Your systolic blood pressure is above 130. Your diastolic blood pressure is above 80. Your personal target blood pressure may vary depending on your medical conditions, your age, and other factors. Follow these instructions at home: Eating and drinking  Eat a diet that is high in fiber and potassium, and low in sodium, added sugar, and fat. An example of this eating plan is called the DASH diet. DASH stands for Dietary Approaches to Stop Hypertension. To eat this way: Eat   plenty of fresh fruits and vegetables. Try to fill one half of your plate at each meal with fruits and vegetables. Eat whole grains, such as whole-wheat pasta, brown rice, or whole-grain bread. Fill about one  fourth of your plate with whole grains. Eat or drink low-fat dairy products, such as skim milk or low-fat yogurt. Avoid fatty cuts of meat, processed or cured meats, and poultry with skin. Fill about one fourth of your plate with lean proteins, such as fish, chicken without skin, beans, eggs, or tofu. Avoid pre-made and processed foods. These tend to be higher in sodium, added sugar, and fat. Reduce your daily sodium intake. Many people with hypertension should eat less than 1,500 mg of sodium a day. Do not drink alcohol if: Your health care provider tells you not to drink. You are pregnant, may be pregnant, or are planning to become pregnant. If you drink alcohol: Limit how much you have to: 0-1 drink a day for women. 0-2 drinks a day for men. Know how much alcohol is in your drink. In the [place] one drink equals one 12 oz bottle of beer (355 mL), one 5 oz glass of wine (148 mL), or one 1 oz glass of hard liquor (44 mL). Lifestyle  Work with your health care provider to maintain a healthy body weight or to lose weight. Ask what an ideal weight is for you. Get at least 30 minutes of exercise that causes your heart to beat faster (aerobic exercise) most days of the week. Activities may include walking, swimming, or biking. Include exercise to strengthen your muscles (resistance exercise), such as Pilates or lifting weights, as part of your weekly exercise routine. Try to do these types of exercises for 30 minutes at least 3 days a week. Do not use any products that contain nicotine or tobacco. These products include cigarettes, chewing tobacco, and vaping devices, such as e-cigarettes. If you need help quitting, ask your health care provider. Monitor your blood pressure at home as told by your health care provider. Keep all follow-up visits. This is important. Medicines Take over-the-counter and prescription medicines only as told by your health care provider. Follow directions carefully. Blood  pressure medicines must be taken as prescribed. Do not skip doses of blood pressure medicine. Doing this puts you at risk for problems and can make the medicine less effective. Ask your health care provider about side effects or reactions to medicines that you should watch for. Contact a health care provider if you: Think you are having a reaction to a medicine you are taking. Have headaches that keep coming back (recurring). Feel dizzy. Have swelling in your ankles. Have trouble with your vision. Get help right away if you: Develop a severe headache or confusion. Have unusual weakness or numbness. Feel faint. Have severe pain in your chest or abdomen. Vomit repeatedly. Have trouble breathing. These symptoms may be an emergency. Get help right away. Call 911. Do not wait to see if the symptoms will go away. Do not drive yourself to the hospital. Summary Hypertension is when the force of blood pumping through your arteries is too strong. If this condition is not controlled, it may put you at risk for serious complications. Your personal target blood pressure may vary depending on your medical conditions, your age, and other factors. For most people, a normal blood pressure is less than 120/80. Hypertension is treated with lifestyle changes, medicines, or a combination of both. Lifestyle changes include losing weight, eating a healthy,   low-sodium diet, exercising more, and limiting alcohol. This information is not intended to replace advice given to you by your health care provider. Make sure you discuss any questions you have with your health care provider. Document Revised: 02/02/2021 Document Reviewed: 02/02/2021 Elsevier Patient Education  2023 [employer]  

## 2021-09-16 ENCOUNTER — Ambulatory Visit (INDEPENDENT_AMBULATORY_CARE_PROVIDER_SITE_OTHER): Payer: No Typology Code available for payment source | Admitting: Neurology

## 2021-09-16 DIAGNOSIS — Z820 Family history of epilepsy and other diseases of the nervous system: Secondary | ICD-10-CM

## 2021-09-16 DIAGNOSIS — R519 Headache, unspecified: Secondary | ICD-10-CM

## 2021-09-16 DIAGNOSIS — G4761 Periodic limb movement disorder: Secondary | ICD-10-CM

## 2021-09-16 DIAGNOSIS — R0683 Snoring: Secondary | ICD-10-CM | POA: Diagnosis not present

## 2021-09-16 DIAGNOSIS — G4733 Obstructive sleep apnea (adult) (pediatric): Secondary | ICD-10-CM

## 2021-09-16 DIAGNOSIS — G4720 Circadian rhythm sleep disorder, unspecified type: Secondary | ICD-10-CM

## 2021-09-16 DIAGNOSIS — G4719 Other hypersomnia: Secondary | ICD-10-CM

## 2021-09-24 NOTE — Procedures (Signed)
PATIENT'S NAME:  Adam [name], Adam [name] DOB:      December 21, 1970      MR#:    [id number]     DATE OF RECORDING: 09/16/2021 REFERRING M.D.:  Sanda Linger, MD Study Performed:   Baseline Polysomnogram HISTORY: 51 year old man with a history of reflux disease, hiatal hernia, kidney stones, anxiety, irritable bowel syndrome, hypertension and borderline obesity, who reports snoring and excessive daytime somnolence as well as recurrent headaches. The patient endorsed the Epworth Sleepiness Scale at 12 points. The patient's weight 198 pounds with a height of 68 (inches), resulting in a BMI of 30.1 kg/m2. The patient's neck circumference measured 17.6 inches.  CURRENT MEDICATIONS: Imodium, Claritin, Multivitamin, Aleve, Benicar, Prilosec   PROCEDURE:  This is a multichannel digital polysomnogram utilizing the Somnostar 11.2 system.  Electrodes and sensors were applied and monitored per AASM Specifications.   EEG, EOG, Chin and Limb EMG, were sampled at 200 Hz.  ECG, Snore and Nasal Pressure, Thermal Airflow, Respiratory Effort, CPAP Flow and Pressure, Oximetry was sampled at 50 Hz. Digital video and audio were recorded.      BASELINE STUDY  Lights Out was at 22:04 and Lights On at 05:07.  Total recording time (TRT) was 423.5 minutes, with a total sleep time (TST) of 312.5 minutes.   The patient's sleep latency was 76 minutes, which is delayed. REM latency was 107.5 minutes.  The sleep efficiency was 73.8 %.     SLEEP ARCHITECTURE: WASO (Wake after sleep onset) was 43.5 minutes with a few longer periods of wakefulness, but otherwise no significant sleep fragmentation.  There were 15.5 minutes in Stage N1, 234.5 minutes Stage N2, 0 minutes Stage N3 and 62.5 minutes in Stage REM.  The percentage of Stage N1 was 5.%, Stage N2 was 75.%, which is markedly increased, Stage N3 was absent, and Stage R (REM sleep) was 20.%, which is normal. The arousals were noted as: 16 were spontaneous, 2 were associated with PLMs, 4 were associated  with respiratory events.  RESPIRATORY ANALYSIS:  There were a total of 49 respiratory events:  3 obstructive apneas, 0 central apneas and 2 mixed apneas with a total of 5 apneas and an apnea index (AI) of 1. /hour. There were 44 hypopneas with a hypopnea index of 8.4 /hour. The patient also had 0 respiratory event related arousals (RERAs).      The total APNEA/HYPOPNEA INDEX (AHI) was 9.4/hour and the total RESPIRATORY DISTURBANCE INDEX was  9.4 /hour.  42 events occurred in REM sleep and 14 events in NREM. The REM AHI was  40.3 /hour, versus a non-REM AHI of 1.7. The patient spent 30.5 minutes of total sleep time in the supine position and 282 minutes in non-supine.. The supine AHI was 0.0 versus a non-supine AHI of 10.5.  OXYGEN SATURATION & C02:  The Wake baseline 02 saturation was 91%, with the lowest being 77% (during non-supine REM sleep). Time spent below 89% saturation equaled 16 minutes.  PERIODIC LIMB MOVEMENTS: The patient had a total of 83 Periodic Limb Movements.  The Periodic Limb Movement (PLM) index was 15.9 and the PLM Arousal index was .4/hour.  Audio and video analysis did not show any abnormal or unusual movements, behaviors, phonations or vocalizations. The patient took 1 bathroom break. Mild snoring was noted. The EKG was in keeping with normal sinus rhythm (NSR).  Post-study, the patient indicated that sleep was worse than usual.   IMPRESSION:  Obstructive Sleep Apnea (OSA) Periodic Limb Movement Disorder (PLMD) Dysfunctions associated with  sleep stages or arousal from sleep  RECOMMENDATIONS:  This study demonstrates overall mild obstructive sleep apnea, severe in REM sleep, with a total AHI of 9.4/hour, REM AHI of 40.3/hour, and O2 nadir of 77%. The absence of supine REM sleep may have underestimated his sleep disordered breathing to some degree. Given the patient's medical history and sleep related complaints, treatment with positive airway pressure is recommended; this  can be achieved in the form of autoPAP. Alternatively, a full-night CPAP titration study would allow optimization of therapy if needed. Other treatment options may include avoidance of supine sleep position along with weight loss (if indicated), or the use of an oral appliance in selected patients. Please note that untreated obstructive sleep apnea may carry additional perioperative morbidity. Patients with significant obstructive sleep apnea should receive perioperative PAP therapy and the surgeons and particularly the anesthesiologist should be informed of the diagnosis and the severity of the sleep disordered breathing. This study shows some sleep fragmentation and abnormal sleep stage percentages; these are nonspecific findings and per se do not signify an intrinsic sleep disorder or a cause for the patient's sleep-related symptoms. Causes include (but are not limited to) the first night effect of the sleep study, circadian rhythm disturbances, medication effect or an underlying mood disorder or medical problem.  Mild PLMs (periodic limb movements of sleep) were noted during this study with no significant arousals; clinical correlation is recommended. PLMs may improve with OSA treatment (with PAP therapy).  The patient should be cautioned not to drive, work at heights, or operate dangerous or heavy equipment when tired or sleepy. Review and reiteration of good sleep hygiene measures should be pursued with any patient. The patient will be seen in follow-up by Dr. Frances Furbish at [facility] for discussion of the test results and further management strategies. The referring provider will be notified of the test results.  I certify that I have reviewed the entire raw data recording prior to the issuance of this report in accordance with the Standards of Accreditation of the American Academy of Sleep Medicine (AASM)  Huston Foley, MD, PhD Diplomat, American Board of Neurology and Sleep Medicine (Neurology and Sleep  Medicine)

## 2021-09-24 NOTE — Addendum Note (Signed)
Addended by: Huston Foley on: 09/24/2021 01:01 PM   Modules accepted: Orders

## 2021-09-28 ENCOUNTER — Telehealth: Payer: Self-pay

## 2021-09-28 NOTE — Telephone Encounter (Signed)
I called Adam [name]. I advised Adam [name] that Dr. Frances Furbish reviewed their sleep study results and found that Adam [name] has mild osa. Dr. Frances Furbish recommends that Adam [name] start an auto pap at home. I reviewed PAP compliance expectations with the Adam [name]. Adam [name] is agreeable to starting an auto-PAP. I advised Adam [name] that an order will be sent to a DME, [facility], and [facility] will call the Adam [name] within about one week after they file with the Adam [name]'s insurance. [facility] will show the Adam [name] how to use the machine, fit for masks, and troubleshoot the auto-PAP if needed. A follow up appt was made for insurance purposes with Shanda Bumps, NP on 12/23/2021 at 3:45pm. Adam [name] verbalized understanding to arrive 15 minutes early and bring their auto-PAP. A letter with all of this information in it will be mailed to the Adam [name] as a reminder. I verified with the Adam [name] that the address we have on file is correct. Adam [name] verbalized understanding of results. Adam [name] had no questions at this time but was encouraged to call back if questions arise. I have sent the order to [facility] and have received confirmation that they have received the order.

## 2021-09-28 NOTE — Telephone Encounter (Signed)
-----   Message from Huston Foley, MD sent at 09/24/2021  1:01 PM EDT ----- Patient referred by Dr. Yetta Barre, seen by me on 08/04/21, diagnostic PSG on 09/16/21.    Please call and notify the patient that the recent sleep study showed overall mild sleep apnea but much more pronounced during REM/dream sleep.  He had oxygen drops to as low as 77%.  I would recommend treatment for his sleep apnea with an AutoPap machine.  This means, that we don't have to bring him back for a second sleep study with CPAP, but will let him try an autoPAP machine at home, through a DME company (of his choice, or as per insurance requirement). The DME representative will educate him on how to use the machine, how to put the mask on, etc. I have placed an order in the chart. Please send referral, talk to patient, send report to referring MD. We will need a FU in sleep clinic for 10 weeks post-PAP set up, please arrange that with me or one of our NPs. Thanks,   Huston Foley, MD, PhD [facility])

## 2021-09-28 NOTE — Telephone Encounter (Signed)
I called patient to discuss. No answer, left a message asking him to call me back. If patient calls back another day please route to POD 4. 

## 2021-10-19 ENCOUNTER — Ambulatory Visit: Payer: No Typology Code available for payment source | Admitting: Nurse Practitioner

## 2021-10-19 ENCOUNTER — Encounter: Payer: Self-pay | Admitting: Nurse Practitioner

## 2021-10-19 VITALS — BP 128/80 | HR 93 | Temp 97.4°F

## 2021-10-19 DIAGNOSIS — Z789 Other specified health status: Secondary | ICD-10-CM

## 2021-10-19 NOTE — Progress Notes (Signed)
Office Visit.   Subjective:  Patient ID: Adam [name], male    DOB: 10-31-1970  Age: 51 y.o. MRN: [id number]  CC: Wellness exam  HPI Adam [name] presents for wellness exam visit for insurance benefit.  Patient has a PCP: Dr. Maisie Fus, last office visit 09/02/21 PMH significant for: Anxiety which he reports is controlled at this time, not currently on any medication. Hx of HTN on olmesartan. Reports BP was elevated yesterday, causing a headache. Also reports increased stress at work Recently diagnosed with sleep apnea and is the process of obtaining CPAP machine.  Last labs per PCP were completed Feb and May 2023.   Health Maintenance:  Colonoscopy: Due for this, last one at age 64 (positive family history). Current in the process of following GI.  PSA: last one of was 0.79 (2/23)    Smoker: Never; social drinker.   Immunizations:  Shingrix- x 2 completed this year. TDAP- needs updated.  Up to date with COVID.  Lifestyle: Reports he tries to monitor sodium intake. States he does well during the week but tends to splurge some during the weekend.  Exercises rarely but states he stays pretty active at work.    Past Medical History:  Diagnosis Date   Anxiety    GERD (gastroesophageal reflux disease)    Hiatal hernia    Kidney stones     No past surgical history on file.  Outpatient Medications Prior to Visit  Medication Sig Dispense Refill   loperamide (IMODIUM) 2 MG capsule Take by mouth as needed for diarrhea or loose stools. Pt takes 4 tabs daily     loratadine (CLARITIN) 10 MG tablet Take 10 mg by mouth daily.     Multiple Vitamin (MULTIVITAMIN PO) Take by mouth.     naproxen sodium (ALEVE) 220 MG tablet Take 220 mg by mouth daily as needed.     olmesartan (BENICAR) 20 MG tablet TAKE 1 TABLET BY MOUTH EVERY DAY 30 tablet 2   omeprazole (PRILOSEC) 20 MG capsule Take 20 mg by mouth daily.     No facility-administered medications prior to visit.    ROS Review of Systems   Respiratory:  Negative for shortness of breath.   Cardiovascular:  Negative for chest pain and leg swelling.  Neurological:  Positive for headaches (sometimes when blood pressure is high.).    Objective:  BP 128/80   Pulse 93   Temp (!) 97.4 F (36.3 C)   SpO2 98%   Physical Exam Constitutional:      General: He is not in acute distress. HENT:     Head: Normocephalic.  Cardiovascular:     Rate and Rhythm: Normal rate and regular rhythm.     Pulses: Normal pulses.  Pulmonary:     Effort: Pulmonary effort is normal.     Breath sounds: Normal breath sounds.  Skin:    General: Skin is warm and dry.  Neurological:     General: No focal deficit present.     Mental Status: He is alert and oriented to person, place, and time.  Psychiatric:        Mood and Affect: Mood normal.      Assessment & Plan:   Nalu was seen today for wellness exam.  Diagnoses and all orders for this visit:  Participant in health and wellness plan  Adult wellness physical was conducted today. Diet and exercise were discussed.  We reviewed immunizations and gave recommendations regarding current immunization needed for age. Patient is  due to Tdap, he will return once immunization is available in clinic.  Again encouraged to continue to follow-up with GI for colonoscopy. Recommended checking BP daily over the next 2 weeks to monitor BP. Discussed BP goal less than 130/80. Enocuraged to follow-up if BP remains elevated.   Patient was advised yearly wellness exam  No orders of the defined types were placed in this encounter.   No orders of the defined types were placed in this encounter.

## 2021-11-02 LAB — HM COLONOSCOPY

## 2021-12-23 ENCOUNTER — Ambulatory Visit (INDEPENDENT_AMBULATORY_CARE_PROVIDER_SITE_OTHER): Payer: No Typology Code available for payment source | Admitting: Adult Health

## 2021-12-23 ENCOUNTER — Encounter: Payer: Self-pay | Admitting: Adult Health

## 2021-12-23 VITALS — BP 127/80 | HR 92 | Ht 68.0 in | Wt 196.4 lb

## 2021-12-23 DIAGNOSIS — Z9989 Dependence on other enabling machines and devices: Secondary | ICD-10-CM | POA: Diagnosis not present

## 2021-12-23 DIAGNOSIS — G4733 Obstructive sleep apnea (adult) (pediatric): Secondary | ICD-10-CM

## 2021-12-23 NOTE — Progress Notes (Signed)
[facility] [address] [place]. [place] [address] [phone]       OFFICE FOLLOW UP NOTE  Mr. Adam [name] Date of Birth:  [birth year] Medical Record Number:  [id number]   Primary neurologist: Dr. Frances Furbish Reason for visit: Initial CPAP follow-up    SUBJECTIVE:   CHIEF COMPLAINT:  Chief Complaint  Patient presents with   Obstructive Sleep Apnea    Pt reports doing good and tolerating Cpap. No questions or concerns. Room 3 alone    HPI:   Update 12/23/2021 JM: Patient is being seen for initial CPAP compliance visit.  Completed sleep study 6/8 which showed overall mild OSA with total AHI 9.4/h although severe in rem sleep with a REM AHI of 40.3/h.  Recommended initiating AutoPap which was started on 8/2.   Review of compliance report below shows excellent usage and optimal residual AHI.  Reports some improvement of fatigue but still feels quite sleepy upon awakening.  Thankfully, he has not had any further issues with headaches.  Unsure if he is still snoring as he sleeps alone.  He does have some issues with mask leaks particularly when laying on side.  He has not tried other mask options.  Epworth Sleepiness Scale 9/24 (prior to CPAP 12/24).  Fatigue severity scale 25/63 (39/63).          Consult visit 08/04/2021 Dr. Frances Furbish: Mr. Adam [name] is a 51 year old right-handed gentleman with an underlying medical history of reflux disease, hiatal hernia, kidney stones, anxiety, irritable bowel syndrome, hypertension and borderline obesity, who reports snoring and excessive daytime somnolence as well as recurrent headaches.  As well as difficulty initiating sleep.  I reviewed your office note from 06/02/2021.  He was started losartan and his blood pressure has improved for the most part and his recurrent headaches have actually improved quite a bit from nearly daily to about twice a week.  He does occasionally wake up with a headache.  He denies night to night nocturia.  His  mom has sleep apnea and has a CPAP machine.  He would consider treatment with a CPAP if necessary.  His Epworth sleepiness score is 12 out of 24, fatigue severity score is 39 out of 63.  He is single and lives alone.  He has no children.  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  He does have stress at work.  He works typically from 8:30 AM to 5 PM or thereabouts.  Bedtime is generally around 11 PM and rise time around 7 AM.  He does drink quite a bit of caffeine in the form of soda, typically between 4 and 6 cans/day.  He is a non-smoker and drinks alcohol occasionally, 2-3 drinks per week.  Weight has been fluctuating, generally within 10 pounds.      ROS:   14 system review of systems performed and negative with exception of those listed in HPI  PMH:  Past Medical History:  Diagnosis Date   Anxiety    GERD (gastroesophageal reflux disease)    Hiatal hernia    Kidney stones     PSH: No past surgical history on file.  Social History:  Social History   Socioeconomic History   Marital status: Single    Spouse name: Not on file   Number of children: Not on file   Years of education: Not on file   Highest education level: Not on file  Occupational History   Not on file  Tobacco Use   Smoking status: Never   Smokeless tobacco: Never  Substance and Sexual Activity   Alcohol use: Yes    Alcohol/week: 3.0 standard drinks of alcohol    Types: 3 Shots of liquor per week   Drug use: Never   Sexual activity: Not Currently    Partners: Male  Other Topics Concern   Not on file  Social History Narrative   Not on file   Social Determinants of Health   Financial Resource Strain: Not on file  Food Insecurity: Not on file  Transportation Needs: Not on file  Physical Activity: Not on file  Stress: Not on file  Social Connections: Not on file  Intimate Partner Violence: Not on file    Family History:  Family History  Problem  Relation Age of Onset   Sleep apnea Mother    Hypertension Father     Medications:   Current Outpatient Medications on File Prior to Visit  Medication Sig Dispense Refill   loperamide (IMODIUM) 2 MG capsule Take by mouth as needed for diarrhea or loose stools. Pt takes 4 tabs daily     loratadine (CLARITIN) 10 MG tablet Take 10 mg by mouth daily.     Multiple Vitamin (MULTIVITAMIN PO) Take by mouth.     naproxen sodium (ALEVE) 220 MG tablet Take 220 mg by mouth daily as needed.     olmesartan (BENICAR) 20 MG tablet TAKE 1 TABLET BY MOUTH EVERY DAY 30 tablet 2   omeprazole (PRILOSEC) 20 MG capsule Take 20 mg by mouth daily.     No current facility-administered medications on file prior to visit.    Allergies:   Allergies  Allergen Reactions   Ivp Dye [Iodinated Contrast Media] Hives and Swelling      OBJECTIVE:  Physical Exam  Vitals:   12/23/21 1538  BP: 127/80  Pulse: 92  Weight: 196 lb 6 oz (89.1 kg)  Height: 5\' 8"  (1.727 m)   Body mass index is 29.86 kg/m. No results found.   General: well developed, well nourished, pleasant middle-age male, seated, in no evident distress Head: head normocephalic and atraumatic.   Neck: supple with no carotid or supraclavicular bruits Cardiovascular: regular rate and rhythm, no murmurs Musculoskeletal: no deformity Skin:  no rash/petichiae Vascular:  Normal pulses all extremities   Neurologic Exam Mental Status: Awake and fully alert. Oriented to place and time. Recent and remote memory intact. Attention span, concentration and fund of knowledge appropriate. Mood and affect appropriate.  Cranial Nerves: Pupils equal, briskly reactive to light. Extraocular movements full without nystagmus. Visual fields full to confrontation. Hearing intact. Facial sensation intact. Face, tongue, palate moves normally and symmetrically.  Motor: Normal bulk and tone. Normal strength in all tested extremity muscles Sensory.: intact to touch ,  pinprick , position and vibratory sensation.  Coordination: Rapid alternating movements normal in all extremities. Finger-to-nose and heel-to-shin performed accurately bilaterally. Gait and Station: Arises from chair without difficulty. Stance is normal. Gait demonstrates normal stride length and balance without use of AD. Tandem walk and heel toe without difficulty.  Reflexes: 1+ and symmetric. Toes downgoing.         ASSESSMENT/PLAN: Adam [name] is a 51 y.o. year old male     OSA on CPAP : Compliance report shows satisfactory usage with optimal residual AHI.  Continue current pressure settings.  Discussed ways to help with mask leak, will also place order requesting mask refitting.  Discussed typical timeframe it may take to  see full benefit with use of CPAP. Advised if he continues to experience fatigue upon awakening, can consider completing ONO. If this is normal, suspect underlying fatigue stemming from different cause than apnea.  Discussed continued nightly usage with ensuring greater than 4 hours nightly for optimal benefit and per insurance purposes.  Continue to follow with DME company for any needed supplies or CPAP related concerns     Follow up in 6 months or call earlier if needed   CC:  PCP: Etta Grandchild, MD    I spent 26 minutes of face-to-face and non-face-to-face time with patient.  This included previsit chart review, lab review, study review, order entry, electronic health record documentation, patient education regarding diagnosis of sleep apnea with review and discussion of compliance report and answered all other questions to patient's satisfaction   Ihor Austin, [facility]  [facility] [address] [place], [place] [address]  Phone [phone] Fax [phone] Note: This document was prepared with digital dictation and possible smart phrase technology. Any transcriptional errors that result from this process are  unintentional.

## 2021-12-28 ENCOUNTER — Other Ambulatory Visit: Payer: Self-pay | Admitting: Nurse Practitioner

## 2021-12-28 DIAGNOSIS — Z23 Encounter for immunization: Secondary | ICD-10-CM

## 2021-12-28 NOTE — Progress Notes (Addendum)
TDAP administered to left deltoid today. Lot: [id number]. Exp date: 12/29/23.  CDC VIS declined.  Tolerated well. No immediate adverse reactions noted.

## 2022-01-24 ENCOUNTER — Other Ambulatory Visit: Payer: Self-pay | Admitting: Internal Medicine

## 2022-01-24 DIAGNOSIS — I10 Essential (primary) hypertension: Secondary | ICD-10-CM

## 2022-03-07 ENCOUNTER — Encounter: Payer: Self-pay | Admitting: Internal Medicine

## 2022-03-07 ENCOUNTER — Ambulatory Visit (INDEPENDENT_AMBULATORY_CARE_PROVIDER_SITE_OTHER): Payer: No Typology Code available for payment source | Admitting: Internal Medicine

## 2022-03-07 VITALS — BP 128/82 | HR 98 | Temp 98.3°F | Ht 68.0 in | Wt 200.0 lb

## 2022-03-07 DIAGNOSIS — G4733 Obstructive sleep apnea (adult) (pediatric): Secondary | ICD-10-CM | POA: Diagnosis not present

## 2022-03-07 DIAGNOSIS — I10 Essential (primary) hypertension: Secondary | ICD-10-CM | POA: Diagnosis not present

## 2022-03-07 NOTE — Progress Notes (Signed)
Subjective:  Patient ID: Adam [name], male    DOB: [birth year]  Age: 51 y.o. MRN: [id number]  CC: Hypertension   HPI FAARIS ARIZPE presents for f/up - -  He feels well and offers no complaints.  Since I last saw him he started using CPAP.  He is active and denies chest pain, shortness of breath, diaphoresis, or edema.  Outpatient Medications Prior to Visit  Medication Sig Dispense Refill   loperamide (IMODIUM) 2 MG capsule Take by mouth as needed for diarrhea or loose stools. Pt takes 4 tabs daily     loratadine (CLARITIN) 10 MG tablet Take 10 mg by mouth daily.     Multiple Vitamin (MULTIVITAMIN PO) Take by mouth.     naproxen sodium (ALEVE) 220 MG tablet Take 220 mg by mouth daily as needed.     olmesartan (BENICAR) 20 MG tablet TAKE 1 TABLET BY MOUTH EVERY DAY 30 tablet 2   omeprazole (PRILOSEC) 20 MG capsule Take 20 mg by mouth daily.     No facility-administered medications prior to visit.    ROS Review of Systems  Constitutional: Negative.  Negative for diaphoresis and fatigue.  HENT: Negative.    Eyes: Negative.   Respiratory:  Positive for apnea. Negative for cough, shortness of breath and wheezing.   Cardiovascular:  Negative for chest pain, palpitations and leg swelling.  Gastrointestinal:  Negative for abdominal pain, constipation and diarrhea.  Genitourinary: Negative.  Negative for difficulty urinating.  Musculoskeletal: Negative.   Skin: Negative.   Neurological: Negative.  Negative for dizziness, weakness and light-headedness.  Hematological:  Negative for adenopathy. Does not bruise/bleed easily.  Psychiatric/Behavioral: Negative.      Objective:  BP 128/82 (BP Location: Left Arm, Patient Position: Sitting, Cuff Size: Large)   Pulse 98   Temp 98.3 F (36.8 C) (Oral)   Ht 5\' 8"  (1.727 m)   Wt 200 lb (90.7 kg)   SpO2 95%   BMI 30.41 kg/m   BP Readings from Last 3 Encounters:  03/07/22 128/82  12/23/21 127/80  10/19/21 128/80    Wt Readings from  Last 3 Encounters:  03/07/22 200 lb (90.7 kg)  12/23/21 196 lb 6 oz (89.1 kg)  09/02/21 196 lb (88.9 kg)    Physical Exam Vitals reviewed.  HENT:     Nose: Nose normal.     Mouth/Throat:     Mouth: Mucous membranes are moist.  Eyes:     General: No scleral icterus. Cardiovascular:     Rate and Rhythm: Normal rate and regular rhythm.     Heart sounds: No murmur heard. Pulmonary:     Effort: Pulmonary effort is normal.     Breath sounds: No stridor. No wheezing, rhonchi or rales.  Abdominal:     General: Abdomen is flat.     Palpations: There is no mass.     Tenderness: There is no abdominal tenderness. There is no guarding.     Hernia: No hernia is present.  Musculoskeletal:        General: Normal range of motion.     Cervical back: Neck supple.     Right lower leg: No edema.  Lymphadenopathy:     Cervical: No cervical adenopathy.  Skin:    General: Skin is warm and dry.  Neurological:     General: No focal deficit present.     Mental Status: He is alert.  Psychiatric:        Mood and Affect: Mood normal.  Behavior: Behavior normal.     Lab Results  Component Value Date   WBC 6.5 06/02/2021   HGB 14.0 06/02/2021   HCT 41.3 06/02/2021   PLT 218.0 06/02/2021   GLUCOSE 92 09/02/2021   CHOL 225 (H) 06/02/2021   TRIG 335.0 (H) 06/02/2021   HDL 42.40 06/02/2021   LDLDIRECT 150.0 06/02/2021   LDLCALC 151 (H) 05/28/2019   ALT 25 06/02/2021   AST 20 06/02/2021   NA 139 09/02/2021   K 4.0 09/02/2021   CL 100 09/02/2021   CREATININE 0.95 09/02/2021   BUN 14 09/02/2021   CO2 29 09/02/2021   TSH 1.64 06/02/2021   PSA 0.79 06/02/2021    DG Abd 1 View  Result Date: 01/03/2011 *RADIOLOGY REPORT* Clinical Data: Lithotripsy ABDOMEN - 1 VIEW Comparison: 12/23/2010 Findings: Bilateral nephrolithiasis is stable.  Stool in the ascending and proximal transverse colon is stable.  No disproportionate dilatation of bowel.  Unremarkable bony framework. IMPRESSION:  Bilateral nephrolithiasis. Original Report Authenticated By: Jamas Lav, M.D.   Assessment & Plan:   Kaenon was seen today for hypertension.  Diagnoses and all orders for this visit:  Primary hypertension- His blood pressure is well-controlled.  Will continue the current dose of the ARB.  OSA on CPAP- He is doing well on CPAP.   I am having Mersadie Kavanaugh Broom. Geesey maintain his naproxen sodium, loperamide, loratadine, omeprazole, Multiple Vitamin (MULTIVITAMIN PO), and olmesartan.  No orders of the defined types were placed in this encounter.    Follow-up: No follow-ups on file.  Scarlette Calico, MD

## 2022-04-25 ENCOUNTER — Other Ambulatory Visit: Payer: Self-pay | Admitting: Internal Medicine

## 2022-04-25 DIAGNOSIS — I10 Essential (primary) hypertension: Secondary | ICD-10-CM

## 2022-05-16 ENCOUNTER — Telehealth: Payer: Self-pay | Admitting: Internal Medicine

## 2022-05-16 ENCOUNTER — Other Ambulatory Visit: Payer: Self-pay | Admitting: Internal Medicine

## 2022-05-16 DIAGNOSIS — U071 COVID-19: Secondary | ICD-10-CM | POA: Insufficient documentation

## 2022-05-16 MED ORDER — NIRMATRELVIR/RITONAVIR (PAXLOVID)TABLET: 3.0000 | ORAL_TABLET | Freq: Two times a day (BID) | ORAL | 0 refills | Status: AC

## 2022-05-16 NOTE — Telephone Encounter (Signed)
Patient tested positive for Covid 05/15/22. He would like to know if Dr. Ronnald Ramp thinks he needs to be prescribed Paxlovid. Best callback is [phone]

## 2022-05-25 ENCOUNTER — Telehealth: Payer: Self-pay | Admitting: Adult Health

## 2022-05-25 NOTE — Telephone Encounter (Signed)
LVM and sent mychart msg informing pt of need to reschedule 06/27/22 appointment - NP out

## 2022-06-27 ENCOUNTER — Ambulatory Visit: Payer: No Typology Code available for payment source | Admitting: Adult Health

## 2022-07-12 NOTE — Progress Notes (Deleted)
[facility] [address] [place]. [place] [address] [phone]       OFFICE FOLLOW UP NOTE  Mr. Adam [name] Date of Birth:  [birth year] Medical Record Number:  [id number]   Primary neurologist: Dr. Rexene Alberts Reason for visit: Initial CPAP follow-up    SUBJECTIVE:   CHIEF COMPLAINT:  No chief complaint on file.  Follow-up visit:   Prior visit: 12/23/2021    Brief HPI:   Adam [name] is a 52 y.o. male who is being followed for OSA on CPAP.  Initially seen by Dr. Rexene Alberts on 08/04/2021 for concern of underlying sleep apnea. Completed sleep study 6/8 which showed overall mild OSA with total AHI 9.4/h although severe in rem sleep with a REM AHI of 40.3/h.  Recommended initiating AutoPap which was started on 11/10/2021.  At prior visit, compliance report showed excellent usage and optimal residual AHI.  Tolerating CPAP well.  Review of compliance report below shows excellent usage and optimal residual AHI.  Reports some improvement of fatigue but still feels quite sleepy upon awakening.  Thankfully, he has not had any further issues with headaches.  Unsure if he is still snoring as he sleeps alone.  He does have some issues with mask leaks particularly when laying on side.  He has not tried other mask options.  Epworth Sleepiness Scale 9/24 (prior to CPAP 12/24).  Fatigue severity scale 25/63 (39/63).           ROS:   14 system review of systems performed and negative with exception of those listed in HPI  PMH:  Past Medical History:  Diagnosis Date   Anxiety    GERD (gastroesophageal reflux disease)    Hiatal hernia    Kidney stones     PSH: No past surgical history on file.  Social History:  Social History   Socioeconomic History   Marital status: Single    Spouse name: Not on file   Number of children: Not on file   Years of education: Not on file   Highest education level: Not on file  Occupational History   Not on file  Tobacco Use    Smoking status: Never   Smokeless tobacco: Never  Substance and Sexual Activity   Alcohol use: Yes    Alcohol/week: 3.0 standard drinks of alcohol    Types: 3 Shots of liquor per week   Drug use: Never   Sexual activity: Not Currently    Partners: Male  Other Topics Concern   Not on file  Social History Narrative   Not on file   Social Determinants of Health   Financial Resource Strain: Not on file  Food Insecurity: Not on file  Transportation Needs: Not on file  Physical Activity: Not on file  Stress: Not on file  Social Connections: Not on file  Intimate Partner Violence: Not on file    Family History:  Family History  Problem Relation Age of Onset   Sleep apnea Mother    Hypertension Father     Medications:   Current Outpatient Medications on File Prior to Visit  Medication Sig Dispense Refill   loperamide (IMODIUM) 2 MG capsule Take by mouth as needed for diarrhea or loose stools. Pt takes 4 tabs daily     loratadine (CLARITIN) 10 MG tablet Take 10 mg by mouth daily.     Multiple Vitamin (MULTIVITAMIN PO) Take by mouth.     naproxen sodium (ALEVE) 220 MG tablet Take 220 mg by mouth daily  as needed.     olmesartan (BENICAR) 20 MG tablet TAKE 1 TABLET BY MOUTH EVERY DAY 30 tablet 2   omeprazole (PRILOSEC) 20 MG capsule Take 20 mg by mouth daily.     No current facility-administered medications on file prior to visit.    Allergies:   Allergies  Allergen Reactions   Ivp Dye [Iodinated Contrast Media] Hives and Swelling      OBJECTIVE:  Physical Exam  There were no vitals filed for this visit.  There is no height or weight on file to calculate BMI. No results found.   General: well developed, well nourished, pleasant middle-age male, seated, in no evident distress Head: head normocephalic and atraumatic.   Neck: supple with no carotid or supraclavicular bruits Cardiovascular: regular rate and rhythm, no murmurs Musculoskeletal: no deformity Skin:  no  rash/petichiae Vascular:  Normal pulses all extremities   Neurologic Exam Mental Status: Awake and fully alert. Oriented to place and time. Recent and remote memory intact. Attention span, concentration and fund of knowledge appropriate. Mood and affect appropriate.  Cranial Nerves: Pupils equal, briskly reactive to light. Extraocular movements full without nystagmus. Visual fields full to confrontation. Hearing intact. Facial sensation intact. Face, tongue, palate moves normally and symmetrically.  Motor: Normal bulk and tone. Normal strength in all tested extremity muscles Sensory.: intact to touch , pinprick , position and vibratory sensation.  Coordination: Rapid alternating movements normal in all extremities. Finger-to-nose and heel-to-shin performed accurately bilaterally. Gait and Station: Arises from chair without difficulty. Stance is normal. Gait demonstrates normal stride length and balance without use of AD. Tandem walk and heel toe without difficulty.  Reflexes: 1+ and symmetric. Toes downgoing.         ASSESSMENT/PLAN: Adam [name] is a 52 y.o. year old male     OSA on CPAP : Compliance report shows satisfactory usage with optimal residual AHI.  Continue current pressure settings.  Discussed ways to help with mask leak, will also place order requesting mask refitting.  Discussed typical timeframe it may take to see full benefit with use of CPAP. Advised if he continues to experience fatigue upon awakening, can consider completing ONO. If this is normal, suspect underlying fatigue stemming from different cause than apnea.  Discussed continued nightly usage with ensuring greater than 4 hours nightly for optimal benefit and per insurance purposes.  Continue to follow with DME company for any needed supplies or CPAP related concerns     Follow up in 6 months or call earlier if needed   CC:  PCP: Janith Lima, MD    I spent 26 minutes of face-to-face and non-face-to-face  time with patient.  This included previsit chart review, lab review, study review, order entry, electronic health record documentation, patient education regarding diagnosis of sleep apnea with review and discussion of compliance report and answered all other questions to patient's satisfaction   Frann Rider, [facility]  [facility] [address] [place],  [address]  Phone [phone] Fax [phone] Note: This document was prepared with digital dictation and possible smart phrase technology. Any transcriptional errors that result from this process are unintentional.

## 2022-07-13 ENCOUNTER — Ambulatory Visit: Payer: No Typology Code available for payment source | Admitting: Adult Health

## 2022-07-23 ENCOUNTER — Other Ambulatory Visit: Payer: Self-pay | Admitting: Internal Medicine

## 2022-07-23 DIAGNOSIS — I10 Essential (primary) hypertension: Secondary | ICD-10-CM

## 2022-09-07 ENCOUNTER — Ambulatory Visit: Payer: No Typology Code available for payment source | Admitting: Internal Medicine

## 2022-09-07 ENCOUNTER — Encounter: Payer: Self-pay | Admitting: Internal Medicine

## 2022-09-07 VITALS — BP 126/82 | HR 87 | Temp 98.3°F | Resp 16 | Ht 68.0 in | Wt 196.0 lb

## 2022-09-07 DIAGNOSIS — E785 Hyperlipidemia, unspecified: Secondary | ICD-10-CM | POA: Diagnosis not present

## 2022-09-07 DIAGNOSIS — I10 Essential (primary) hypertension: Secondary | ICD-10-CM | POA: Diagnosis not present

## 2022-09-07 DIAGNOSIS — Z0000 Encounter for general adult medical examination without abnormal findings: Secondary | ICD-10-CM | POA: Diagnosis not present

## 2022-09-07 DIAGNOSIS — Z0001 Encounter for general adult medical examination with abnormal findings: Secondary | ICD-10-CM

## 2022-09-07 LAB — HEPATIC FUNCTION PANEL
ALT: 25 U/L (ref 0–53)
AST: 22 U/L (ref 0–37)
Albumin: 4.1 g/dL (ref 3.5–5.2)
Alkaline Phosphatase: 72 U/L (ref 39–117)
Bilirubin, Direct: 0 mg/dL (ref 0.0–0.3)
Total Bilirubin: 0.3 mg/dL (ref 0.2–1.2)
Total Protein: 7.2 g/dL (ref 6.0–8.3)

## 2022-09-07 LAB — CBC WITH DIFFERENTIAL/PLATELET
Basophils Absolute: 0 10*3/uL (ref 0.0–0.1)
Basophils Relative: 0.5 % (ref 0.0–3.0)
Eosinophils Absolute: 0.2 10*3/uL (ref 0.0–0.7)
Eosinophils Relative: 3 % (ref 0.0–5.0)
HCT: 40.6 % (ref 39.0–52.0)
Hemoglobin: 13.8 g/dL (ref 13.0–17.0)
Lymphocytes Relative: 31.8 % (ref 12.0–46.0)
Lymphs Abs: 2.4 10*3/uL (ref 0.7–4.0)
MCHC: 34 g/dL (ref 30.0–36.0)
MCV: 90.9 fl (ref 78.0–100.0)
Monocytes Absolute: 0.7 10*3/uL (ref 0.1–1.0)
Monocytes Relative: 8.7 % (ref 3.0–12.0)
Neutro Abs: 4.3 10*3/uL (ref 1.4–7.7)
Neutrophils Relative %: 56 % (ref 43.0–77.0)
Platelets: 287 10*3/uL (ref 150.0–400.0)
RBC: 4.46 Mil/uL (ref 4.22–5.81)
RDW: 12.8 % (ref 11.5–15.5)
WBC: 7.7 10*3/uL (ref 4.0–10.5)

## 2022-09-07 LAB — LIPID PANEL
Cholesterol: 228 mg/dL — ABNORMAL HIGH (ref 0–200)
HDL: 35.7 mg/dL — ABNORMAL LOW (ref >39.00)
NonHDL: 192.25
Total CHOL/HDL Ratio: 6
Triglycerides: 361 mg/dL — ABNORMAL HIGH (ref 0.0–149.0)
VLDL: 72.2 mg/dL — ABNORMAL HIGH (ref 0.0–40.0)

## 2022-09-07 LAB — BASIC METABOLIC PANEL
BUN: 16 mg/dL (ref 6–23)
CO2: 29 mEq/L (ref 19–32)
Calcium: 9.1 mg/dL (ref 8.4–10.5)
Chloride: 104 mEq/L (ref 96–112)
Creatinine, Ser: 1.12 mg/dL (ref 0.40–1.50)
GFR: 74.63 mL/min (ref >60.00)
Glucose, Bld: 90 mg/dL (ref 70–99)
Potassium: 4 mEq/L (ref 3.5–5.1)
Sodium: 141 mEq/L (ref 135–145)

## 2022-09-07 LAB — TSH: TSH: 1.43 u[IU]/mL (ref 0.35–5.50)

## 2022-09-07 LAB — LDL CHOLESTEROL, DIRECT: Direct LDL: 156 mg/dL

## 2022-09-07 LAB — PSA: PSA: 0.79 ng/mL (ref 0.10–4.00)

## 2022-09-07 NOTE — Progress Notes (Signed)
Subjective:  Patient ID: Adam [name], male    DOB: March 30, 1971  Age: 52 y.o. MRN: [id number]  CC: Annual Exam   HPI GLEAN SIRACUSE presents for a CPX and f/up ---  He does not exercise but when he is active he denies chest pain, shortness of breath, diaphoresis, edema, dizziness, or lightheadedness.  Outpatient Medications Prior to Visit  Medication Sig Dispense Refill   loperamide (IMODIUM) 2 MG capsule Take by mouth as needed for diarrhea or loose stools. Pt takes 4 tabs daily     loratadine (CLARITIN) 10 MG tablet Take 10 mg by mouth daily.     Multiple Vitamin (MULTIVITAMIN PO) Take by mouth.     naproxen sodium (ALEVE) 220 MG tablet Take 220 mg by mouth daily as needed.     olmesartan (BENICAR) 20 MG tablet TAKE 1 TABLET BY MOUTH EVERY DAY 30 tablet 2   omeprazole (PRILOSEC) 20 MG capsule Take 20 mg by mouth daily.     No facility-administered medications prior to visit.    ROS Review of Systems  Constitutional: Negative.  Negative for appetite change, diaphoresis and fatigue.  HENT: Negative.    Eyes: Negative.   Respiratory:  Negative for cough, chest tightness, shortness of breath and wheezing.   Cardiovascular:  Negative for chest pain, palpitations and leg swelling.  Gastrointestinal:  Negative for abdominal pain, constipation, diarrhea, nausea and vomiting.  Endocrine: Negative.   Genitourinary: Negative.  Negative for difficulty urinating.  Musculoskeletal: Negative.  Negative for arthralgias and myalgias.  Skin: Negative.   Neurological:  Negative for dizziness and weakness.  Hematological:  Negative for adenopathy. Does not bruise/bleed easily.  Psychiatric/Behavioral: Negative.      Objective:  BP 126/82 (BP Location: Left Arm, Patient Position: Sitting, Cuff Size: Large)   Pulse 87   Temp 98.3 F (36.8 C) (Oral)   Resp 16   Ht 5\' 8"  (1.727 m)   Wt 196 lb (88.9 kg)   SpO2 96%   BMI 29.80 kg/m   BP Readings from Last 3 Encounters:  09/07/22 126/82   03/07/22 128/82  12/23/21 127/80    Wt Readings from Last 3 Encounters:  09/07/22 196 lb (88.9 kg)  03/07/22 200 lb (90.7 kg)  12/23/21 196 lb 6 oz (89.1 kg)    Physical Exam Vitals reviewed.  Constitutional:      Appearance: Normal appearance.  HENT:     Mouth/Throat:     Mouth: Mucous membranes are moist.  Eyes:     General: No scleral icterus.    Conjunctiva/sclera: Conjunctivae normal.  Cardiovascular:     Rate and Rhythm: Normal rate and regular rhythm.     Heart sounds: No murmur heard. Pulmonary:     Effort: Pulmonary effort is normal.     Breath sounds: No stridor. No wheezing, rhonchi or rales.  Abdominal:     General: Abdomen is protuberant. Bowel sounds are normal. There is no distension.     Palpations: There is no hepatomegaly, splenomegaly or mass.     Tenderness: There is no abdominal tenderness. There is no guarding.     Hernia: No hernia is present.  Musculoskeletal:        General: Normal range of motion.     Cervical back: Neck supple.     Right lower leg: No edema.     Left lower leg: No edema.  Lymphadenopathy:     Cervical: No cervical adenopathy.  Skin:    General: Skin is warm  and dry.  Neurological:     General: No focal deficit present.     Mental Status: He is alert. Mental status is at baseline.  Psychiatric:        Mood and Affect: Mood normal.        Behavior: Behavior normal.     Lab Results  Component Value Date   WBC 7.7 09/07/2022   HGB 13.8 09/07/2022   HCT 40.6 09/07/2022   PLT 287.0 09/07/2022   GLUCOSE 90 09/07/2022   CHOL 228 (H) 09/07/2022   TRIG 361.0 (H) 09/07/2022   HDL 35.70 (L) 09/07/2022   LDLDIRECT 156.0 09/07/2022   LDLCALC 151 (H) 05/28/2019   ALT 25 09/07/2022   AST 22 09/07/2022   NA 141 09/07/2022   K 4.0 09/07/2022   CL 104 09/07/2022   CREATININE 1.12 09/07/2022   BUN 16 09/07/2022   CO2 29 09/07/2022   TSH 1.43 09/07/2022   PSA 0.79 09/07/2022    DG Abd 1 View  Result Date:  01/03/2011 *RADIOLOGY REPORT* Clinical Data: Lithotripsy ABDOMEN - 1 VIEW Comparison: 12/23/2010 Findings: Bilateral nephrolithiasis is stable.  Stool in the ascending and proximal transverse colon is stable.  No disproportionate dilatation of bowel.  Unremarkable bony framework. IMPRESSION: Bilateral nephrolithiasis. Original Report Authenticated By: Donavan Burnet, M.D.   Assessment & Plan:   Encounter for general adult medical examination with abnormal findings- Exam completed, labs reviewed, vaccines are up-to-date, cancer screenings are up-to-date, patient education was given. -     PSA; Future -     TSH; Future -     HIV Antibody (routine testing w rflx); Future  Dyslipidemia, goal LDL below 100- His ASCVD risk score is 9.2%.  Will get a coronary calcium score and consider starting a statin. -     Lipid panel; Future -     Hepatic function panel; Future -     CT CARDIAC SCORING (SELF PAY ONLY); Future  Primary hypertension- His blood pressure is well-controlled.  Electrolytes and renal function are normal. -     Basic metabolic panel; Future -     CBC with Differential/Platelet; Future -     Hepatic function panel; Future -     CT CARDIAC SCORING (SELF PAY ONLY); Future  Other orders -     LDL cholesterol, direct     Follow-up: Return in about 6 months (around 03/10/2023).  Sanda Linger, MD

## 2022-09-07 NOTE — Patient Instructions (Signed)
Health Maintenance, Male Adopting a healthy lifestyle and getting preventive care are important in promoting health and wellness. Ask your health care provider about: The right schedule for you to have regular tests and exams. Things you can do on your own to prevent diseases and keep yourself healthy. What should I know about diet, weight, and exercise? Eat a healthy diet  Eat a diet that includes plenty of vegetables, fruits, low-fat dairy products, and lean protein. Do not eat a lot of foods that are high in solid fats, added sugars, or sodium. Maintain a healthy weight Body mass index (BMI) is a measurement that can be used to identify possible weight problems. It estimates body fat based on height and weight. Your health care provider can help determine your BMI and help you achieve or maintain a healthy weight. Get regular exercise Get regular exercise. This is one of the most important things you can do for your health. Most adults should: Exercise for at least 150 minutes each week. The exercise should increase your heart rate and make you sweat (moderate-intensity exercise). Do strengthening exercises at least twice a week. This is in addition to the moderate-intensity exercise. Spend less time sitting. Even light physical activity can be beneficial. Watch cholesterol and blood lipids Have your blood tested for lipids and cholesterol at 54 years of age, then have this test every 5 years. You may need to have your cholesterol levels checked more often if: Your lipid or cholesterol levels are high. You are older than 54 years of age. You are at high risk for heart disease. What should I know about cancer screening? Many types of cancers can be detected early and may often be prevented. Depending on your health history and family history, you may need to have cancer screening at various ages. This may include screening for: Colorectal cancer. Prostate cancer. Skin cancer. Lung  cancer. What should I know about heart disease, diabetes, and high blood pressure? Blood pressure and heart disease High blood pressure causes heart disease and increases the risk of stroke. This is more likely to develop in people who have high blood pressure readings or are overweight. Talk with your health care provider about your target blood pressure readings. Have your blood pressure checked: Every 3-5 years if you are 18-39 years of age. Every year if you are 40 years old or older. If you are between the ages of 65 and 75 and are a current or former smoker, ask your health care provider if you should have a one-time screening for abdominal aortic aneurysm (AAA). Diabetes Have regular diabetes screenings. This checks your fasting blood sugar level. Have the screening done: Once every three years after age 45 if you are at a normal weight and have a low risk for diabetes. More often and at a younger age if you are overweight or have a high risk for diabetes. What should I know about preventing infection? Hepatitis B If you have a higher risk for hepatitis B, you should be screened for this virus. Talk with your health care provider to find out if you are at risk for hepatitis B infection. Hepatitis C Blood testing is recommended for: Everyone born from 1945 through 1965. Anyone with known risk factors for hepatitis C. Sexually transmitted infections (STIs) You should be screened each year for STIs, including gonorrhea and chlamydia, if: You are sexually active and are younger than 54 years of age. You are older than 54 years of age and your   health care provider tells you that you are at risk for this type of infection. Your sexual activity has changed since you were last screened, and you are at increased risk for chlamydia or gonorrhea. Ask your health care provider if you are at risk. Ask your health care provider about whether you are at high risk for HIV. Your health care provider  may recommend a prescription medicine to help prevent HIV infection. If you choose to take medicine to prevent HIV, you should first get tested for HIV. You should then be tested every 3 months for as long as you are taking the medicine. Follow these instructions at home: Alcohol use Do not drink alcohol if your health care provider tells you not to drink. If you drink alcohol: Limit how much you have to 0-2 drinks a day. Know how much alcohol is in your drink. In the [place] one drink equals one 12 oz bottle of beer (355 mL), one 5 oz glass of wine (148 mL), or one 1 oz glass of hard liquor (44 mL). Lifestyle Do not use any products that contain nicotine or tobacco. These products include cigarettes, chewing tobacco, and vaping devices, such as e-cigarettes. If you need help quitting, ask your health care provider. Do not use street drugs. Do not share needles. Ask your health care provider for help if you need support or information about quitting drugs. General instructions Schedule regular health, dental, and eye exams. Stay current with your vaccines. Tell your health care provider if: You often feel depressed. You have ever been abused or do not feel safe at home. Summary Adopting a healthy lifestyle and getting preventive care are important in promoting health and wellness. Follow your health care provider's instructions about healthy diet, exercising, and getting tested or screened for diseases. Follow your health care provider's instructions on monitoring your cholesterol and blood pressure. This information is not intended to replace advice given to you by your health care provider. Make sure you discuss any questions you have with your health care provider. Document Revised: 08/17/2020 Document Reviewed: 08/17/2020 Elsevier Patient Education  2024 [employer]  

## 2022-09-08 LAB — HIV ANTIBODY (ROUTINE TESTING W REFLEX): HIV 1&2 Ab, 4th Generation: NONREACTIVE

## 2022-10-12 ENCOUNTER — Telehealth: Payer: Self-pay

## 2022-10-12 ENCOUNTER — Encounter: Payer: Self-pay | Admitting: Neurology

## 2022-10-12 ENCOUNTER — Ambulatory Visit: Payer: No Typology Code available for payment source | Admitting: Neurology

## 2022-10-12 VITALS — BP 101/69 | HR 97 | Ht 68.0 in | Wt 197.5 lb

## 2022-10-12 DIAGNOSIS — G4733 Obstructive sleep apnea (adult) (pediatric): Secondary | ICD-10-CM | POA: Diagnosis not present

## 2022-10-12 NOTE — Telephone Encounter (Signed)
Sent message

## 2022-10-12 NOTE — Progress Notes (Signed)
Patient: Adam [name] Date of Birth: 09/01/1970  Reason for Visit: Follow up History from: Patient Primary Neurologist: Adam [name]   ASSESSMENT AND PLAN 52 y.o. year old male   1.  OSA on CPAP (sleep study 09/16/21 which showed overall mild OSA with total AHI 9.4/h although severe in rem sleep with a REM AHI of 40.3/h.  Started AutoPap 11/10/21)  -Commended on superb CPAP compliance, continue nightly use for minimum of 4 hours. -Will check with insurance to see if any other DME companies are options -Continue current settings, send supplies as needed -Follow-up in 1 year virtually or sooner if needed  HISTORY OF PRESENT ILLNESS: Today 10/12/22 Here today for CPAP visit.  Has superb compliance 09/10/2022-10/09/2022 100% greater than 4-hour nightly usage.  Average usage 7 hours 3 minutes.  Pressure 5-12 cm water.  Leak 5.5, AHI 1.2. using nasal mask, notices it tends to leak when wearing out. Has not made a huge difference, but BP has improved. Is less sleepy, not as tired in the evenings, can get more accomplished. Would be interested in a new DME if insurance will allow.  ESS 8.  HISTORY  Update 12/23/2021 Adam [name]: Patient is being seen for initial CPAP compliance visit.  Completed sleep study 6/8 which showed overall mild OSA with total AHI 9.4/h although severe in rem sleep with a REM AHI of 40.3/h.  Recommended initiating AutoPap which was started on 8/2.    Review of compliance report below shows excellent usage and optimal residual AHI.  Reports some improvement of fatigue but still feels quite sleepy upon awakening.  Thankfully, he has not had any further issues with headaches.  Unsure if he is still snoring as he sleeps alone.  He does have some issues with mask leaks particularly when laying on side.  He has not tried other mask options.  Epworth Sleepiness Scale 9/24 (prior to CPAP 12/24).  Fatigue severity scale 25/63 (39/63).                  Consult visit 08/04/2021 Dr. Frances [name]: Mr.  [name] is a 52 year old right-handed gentleman with an underlying medical history of reflux disease, hiatal hernia, kidney stones, anxiety, irritable bowel syndrome, hypertension and borderline obesity, who reports snoring and excessive daytime somnolence as well as recurrent headaches.  As well as difficulty initiating sleep.  I reviewed your office note from 06/02/2021.  He was started losartan and his blood pressure has improved for the most part and his recurrent headaches have actually improved quite a bit from nearly daily to about twice a week.  He does occasionally wake up with a headache.  He denies night to night nocturia.  His mom has sleep apnea and has a CPAP machine.  He would consider treatment with a CPAP if necessary.  His Epworth sleepiness score is 12 out of 24, fatigue severity score is 39 out of 63.  He is single and lives alone.  He has no children.  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  He does have stress at work.  He works typically from 8:30 AM to 5 PM or thereabouts.  Bedtime is generally around 11 PM and rise time around 7 AM.  He does drink quite a bit of caffeine in the form of soda, typically between 4 and 6 cans/day.  He is a non-smoker and drinks alcohol occasionally, 2-3 drinks per week.  Weight has been fluctuating, generally within 10 pounds.  REVIEW OF SYSTEMS: Out of a complete 14 system review of symptoms, the patient complains only of the following symptoms, and all other reviewed systems are negative.  See HPI  ALLERGIES: Allergies  Allergen Reactions   Iodinated Contrast Media Hives, Swelling and Anaphylaxis   Duloxetine Hcl     Other Reaction(s): drowsy   Fluoxetine     Other Reaction(s): zombie, sexual dysfunction   Morphine     Other Reaction(s): HA, nausea    HOME MEDICATIONS: Outpatient Medications Prior to Visit  Medication Sig Dispense Refill   loperamide (IMODIUM) 2 MG capsule Take by mouth as  needed for diarrhea or loose stools. Pt takes 4 tabs daily     loratadine (CLARITIN) 10 MG tablet Take 10 mg by mouth daily.     Multiple Vitamin (MULTIVITAMIN PO) Take by mouth.     naproxen sodium (ALEVE) 220 MG tablet Take 220 mg by mouth daily as needed.     olmesartan (BENICAR) 20 MG tablet TAKE 1 TABLET BY MOUTH EVERY DAY 30 tablet 2   omeprazole (PRILOSEC) 20 MG capsule Take 20 mg by mouth daily.     No facility-administered medications prior to visit.    PAST MEDICAL HISTORY: Past Medical History:  Diagnosis Date   Anxiety    GERD (gastroesophageal reflux disease)    Hiatal hernia    Kidney stones     PAST SURGICAL HISTORY: History reviewed. No pertinent surgical history.  FAMILY HISTORY: Family History  Problem Relation Age of Onset   Sleep apnea Mother    Hypertension Father     SOCIAL HISTORY: Social History   Socioeconomic History   Marital status: Single    Spouse name: Not on file   Number of children: Not on file   Years of education: Not on file   Highest education level: Bachelor's degree (e.g., BA, AB, BS)  Occupational History   Occupation: REGISTERED NURSE    Employer: [employer]  Tobacco Use   Smoking status: Never   Smokeless tobacco: Never  Vaping Use   Vaping Use: Never used  Substance and Sexual Activity   Alcohol use: Not Currently    Alcohol/week: 3.0 standard drinks of alcohol    Types: 3 Standard drinks or equivalent per week   Drug use: Never   Sexual activity: Yes    Partners: Male    Birth control/protection: None  Other Topics Concern   Not on file  Social History Narrative   Not on file   Social Determinants of Health   Financial Resource Strain: Not on file  Food Insecurity: Not on file  Transportation Needs: Not on file  Physical Activity: Not on file  Stress: Not on file  Social Connections: Not on file  Intimate Partner Violence: Not on file    PHYSICAL EXAM  Vitals:   10/12/22  1522  BP: 101/69  Pulse: 97  Weight: 197 lb 8 oz (89.6 kg)  Height: 5\' 8"  (1.727 m)   Body mass index is 30.03 kg/m.  Generalized: Well developed, in no acute distress  Neurological examination  Mentation: Alert oriented to time, place, history taking. Follows all commands speech and language fluent Cranial nerve II-XII: Pupils were equal round reactive to light. Extraocular movements were full, visual field were full on confrontational test. Facial sensation and strength were normal.  Head turning and shoulder shrug  were normal and symmetric. Motor: The motor testing reveals 5 over 5 strength of all 4 extremities. Good symmetric motor  tone is noted throughout.  Sensory: Sensory testing is intact to soft touch on all 4 extremities. No evidence of extinction is noted.  Coordination: Cerebellar testing reveals good finger-nose-finger .  Gait and station: Gait is normal.   DIAGNOSTIC DATA (LABS, IMAGING, TESTING) - I reviewed patient records, labs, notes, testing and imaging myself where available.  Lab Results  Component Value Date   WBC 7.7 09/07/2022   HGB 13.8 09/07/2022   HCT 40.6 09/07/2022   MCV 90.9 09/07/2022   PLT 287.0 09/07/2022      Component Value Date/Time   NA 141 09/07/2022 1556   K 4.0 09/07/2022 1556   CL 104 09/07/2022 1556   CO2 29 09/07/2022 1556   GLUCOSE 90 09/07/2022 1556   BUN 16 09/07/2022 1556   CREATININE 1.12 09/07/2022 1556   CREATININE 0.84 05/28/2019 1610   CALCIUM 9.1 09/07/2022 1556   PROT 7.2 09/07/2022 1556   ALBUMIN 4.1 09/07/2022 1556   AST 22 09/07/2022 1556   ALT 25 09/07/2022 1556   ALKPHOS 72 09/07/2022 1556   BILITOT 0.3 09/07/2022 1556   GFRNONAA 102 05/28/2019 1610   GFRAA 118 05/28/2019 1610   Lab Results  Component Value Date   CHOL 228 (H) 09/07/2022   HDL 35.70 (L) 09/07/2022   LDLCALC 151 (H) 05/28/2019   LDLDIRECT 156.0 09/07/2022   TRIG 361.0 (H) 09/07/2022   CHOLHDL 6 09/07/2022   No results found for:  "HGBA1C" No results found for: "VITAMINB12" Lab Results  Component Value Date   TSH 1.43 09/07/2022    Margie Ege, AGNP-C, DNP 10/12/2022, 4:04 PM [facility] [address], [address] [place], [place] [address] [phone]

## 2022-10-12 NOTE — Patient Instructions (Signed)
Continue superb CPAP compliance! Will try to send order to new DME Follow-up in 1 year or sooner if needed

## 2022-10-25 ENCOUNTER — Other Ambulatory Visit: Payer: Self-pay | Admitting: Internal Medicine

## 2022-10-25 DIAGNOSIS — I10 Essential (primary) hypertension: Secondary | ICD-10-CM

## 2022-11-01 ENCOUNTER — Encounter: Payer: Self-pay | Admitting: Nurse Practitioner

## 2022-11-01 ENCOUNTER — Ambulatory Visit: Payer: No Typology Code available for payment source | Admitting: Nurse Practitioner

## 2022-11-01 VITALS — BP 120/82 | HR 104 | Ht 68.0 in | Wt 200.0 lb

## 2022-11-01 DIAGNOSIS — Z789 Other specified health status: Secondary | ICD-10-CM

## 2022-11-01 NOTE — Progress Notes (Signed)
[facility]  Subjective:  Patient ID: Adam [name], male    DOB: [birth year]  Age: 52 y.o. MRN: [id number]  CC: Wellness exam   HPI Adam [name] presents for wellness exam visit for insurance benefit.  Patient has a PCP: Dr. Yetta Barre PMH significant for: OSA on cpap, HTN, IBS  Last labs per PCP were completed:May 2024.  Health Maintenance:  Colonoscopy: complete July 2023, repeat in 5 years PSA: 0.79 in 2024  Smoker: never  Immunizations:  Shingrix-  completed  Tdap- 2023 Flu- yearly  Covid- completed   Lifestyle: Diet- watches sodium  Exercise- very active at work.      Past Medical History:  Diagnosis Date   Anxiety    GERD (gastroesophageal reflux disease)    Hiatal hernia    Kidney stones     No past surgical history on file.  Outpatient Medications Prior to Visit  Medication Sig Dispense Refill   loperamide (IMODIUM) 2 MG capsule Take by mouth as needed for diarrhea or loose stools. Pt takes 4 tabs daily     loratadine (CLARITIN) 10 MG tablet Take 10 mg by mouth daily.     Multiple Vitamin (MULTIVITAMIN PO) Take by mouth.     naproxen sodium (ALEVE) 220 MG tablet Take 220 mg by mouth daily as needed.     olmesartan (BENICAR) 20 MG tablet TAKE 1 TABLET BY MOUTH EVERY DAY 30 tablet 2   omeprazole (PRILOSEC) 20 MG capsule Take 20 mg by mouth daily.     No facility-administered medications prior to visit.    ROS Review of Systems  Constitutional:  Negative for fatigue and unexpected weight change.  HENT:  Negative for hearing loss.   Respiratory:  Negative for shortness of breath.   Cardiovascular:  Negative for chest pain.  Gastrointestinal:  Positive for diarrhea. Negative for abdominal pain, constipation, nausea and vomiting.  Musculoskeletal:  Positive for arthralgias, back pain and myalgias.  Neurological:  Negative for dizziness and headaches.  Psychiatric/Behavioral:  Negative for sleep disturbance. The patient is not  nervous/anxious.     Objective:  BP 120/82 (BP Location: Left Arm, Patient Position: Sitting)   Pulse (!) 104   Ht 5\' 8"  (1.727 m)   Wt 200 lb (90.7 kg)   SpO2 97%   BMI 30.41 kg/m   Physical Exam Constitutional:      General: He is not in acute distress. HENT:     Head: Normocephalic.     Right Ear: Tympanic membrane, ear canal and external ear normal.     Left Ear: Tympanic membrane, ear canal and external ear normal.     Mouth/Throat:     Mouth: Mucous membranes are moist.     Pharynx: No oropharyngeal exudate.  Eyes:     Pupils: Pupils are equal, round, and reactive to light.  Cardiovascular:     Rate and Rhythm: Normal rate and regular rhythm.     Heart sounds: Normal heart sounds.  Pulmonary:     Effort: Pulmonary effort is normal.     Breath sounds: Normal breath sounds.  Abdominal:     General: Bowel sounds are normal.     Palpations: Abdomen is soft.  Musculoskeletal:     Right lower leg: No edema.     Left lower leg: No edema.  Neurological:     General: No focal deficit present.     Mental Status: He is alert and oriented to person, place, and time.  Psychiatric:  Mood and Affect: Mood normal.        Behavior: Behavior normal.      Assessment & Plan:    Adam [name] was seen today for wellness exam.  Diagnoses and all orders for this visit:  Participant in health and wellness plan Adult wellness physical was conducted today. Importance of diet and exercise were discussed in detail.  We reviewed immunizations and gave recommendations regarding current immunization needed for age.  Preventative health exams are up to date.    Patient was advised yearly wellness exam and follow-up with PCP as recommended.     No orders of the defined types were placed in this encounter.   No orders of the defined types were placed in this encounter.   Follow-up: as needed.

## 2023-01-24 ENCOUNTER — Other Ambulatory Visit: Payer: Self-pay | Admitting: Internal Medicine

## 2023-01-24 DIAGNOSIS — I10 Essential (primary) hypertension: Secondary | ICD-10-CM

## 2023-02-21 ENCOUNTER — Ambulatory Visit (INDEPENDENT_AMBULATORY_CARE_PROVIDER_SITE_OTHER): Payer: No Typology Code available for payment source | Admitting: Internal Medicine

## 2023-02-21 ENCOUNTER — Encounter: Payer: Self-pay | Admitting: Internal Medicine

## 2023-02-21 ENCOUNTER — Ambulatory Visit (INDEPENDENT_AMBULATORY_CARE_PROVIDER_SITE_OTHER): Payer: No Typology Code available for payment source

## 2023-02-21 VITALS — BP 130/82 | HR 97 | Temp 98.9°F | Ht 68.0 in | Wt 198.0 lb

## 2023-02-21 DIAGNOSIS — J101 Influenza due to other identified influenza virus with other respiratory manifestations: Secondary | ICD-10-CM

## 2023-02-21 DIAGNOSIS — I10 Essential (primary) hypertension: Secondary | ICD-10-CM | POA: Diagnosis not present

## 2023-02-21 LAB — POCT INFLUENZA A/B
Influenza A, POC: POSITIVE — AB
Influenza B, POC: NEGATIVE

## 2023-02-21 LAB — POC COVID19 BINAXNOW: SARS Coronavirus 2 Ag: NEGATIVE

## 2023-02-21 MED ORDER — OSELTAMIVIR PHOSPHATE 75 MG PO CAPS
75.0000 mg | ORAL_CAPSULE | Freq: Two times a day (BID) | ORAL | 0 refills | Status: DC
Start: 2023-02-21 — End: 2023-03-13

## 2023-02-21 MED ORDER — PREDNISONE 10 MG PO TABS
ORAL_TABLET | ORAL | 0 refills | Status: DC
Start: 2023-02-21 — End: 2023-03-13

## 2023-02-21 MED ORDER — HYDROCODONE BIT-HOMATROP MBR 5-1.5 MG/5ML PO SOLN: 5.0000 mL | Freq: Four times a day (QID) | ORAL | 0 refills | Status: AC | PRN

## 2023-02-21 MED ORDER — ALBUTEROL SULFATE HFA 108 (90 BASE) MCG/ACT IN AERS: 2.0000 | INHALATION_SPRAY | Freq: Four times a day (QID) | RESPIRATORY_TRACT | 0 refills | Status: DC | PRN

## 2023-02-21 NOTE — Progress Notes (Unsigned)
Patient ID: Adam [name], male   DOB: 1970/07/22, 51 y.o.   MRN: [id number]        Chief Complaint: follow up cough and chest congestion       HPI:  Adam [name] is a 52 y.o. male here with c/o Here with acute onset mild to mod 3 days ST, HA, general weakness and malaise, with prod cough clearish sputum, but Pt denies chest pain, increased sob or doe, wheezing, orthopnea, PND, increased LE swelling, palpitations, dizziness or syncope.  Pt denies polydipsia, polyuria, or new focal neuro s/s.    Pt denies fever, wt loss, night sweats, loss of appetite, or other constitutional symptoms         Wt Readings from Last 3 Encounters:  02/21/23 198 lb (89.8 kg)  11/01/22 200 lb (90.7 kg)  10/12/22 197 lb 8 oz (89.6 kg)   BP Readings from Last 3 Encounters:  02/21/23 130/82  11/01/22 120/82  10/12/22 101/69         Past Medical History:  Diagnosis Date   Anxiety    GERD (gastroesophageal reflux disease)    Hiatal hernia    Kidney stones    History reviewed. No pertinent surgical history.  reports that he has never smoked. He has never used smokeless tobacco. He reports that he does not currently use alcohol after a past usage of about 3.0 standard drinks of alcohol per week. He reports that he does not use drugs. family history includes Hypertension in his father; Sleep apnea in his mother. Allergies  Allergen Reactions   Iodinated Contrast Media Hives, Swelling and Anaphylaxis   Duloxetine Hcl     Other Reaction(s): drowsy   Fluoxetine     Other Reaction(s): zombie, sexual dysfunction   Morphine     Other Reaction(s): HA, nausea   Current Outpatient Medications on File Prior to Visit  Medication Sig Dispense Refill   loperamide (IMODIUM) 2 MG capsule Take by mouth as needed for diarrhea or loose stools. Pt takes 4 tabs daily     loratadine (CLARITIN) 10 MG tablet Take 10 mg by mouth daily.     Multiple Vitamin (MULTIVITAMIN PO) Take by mouth.     naproxen sodium (ALEVE) 220 MG tablet  Take 220 mg by mouth daily as needed.     olmesartan (BENICAR) 20 MG tablet TAKE 1 TABLET BY MOUTH EVERY DAY 30 tablet 2   omeprazole (PRILOSEC) 20 MG capsule Take 20 mg by mouth daily.     triamcinolone cream (KENALOG) 0.1 % PLEASE SEE ATTACHED FOR DETAILED DIRECTIONS     No current facility-administered medications on file prior to visit.        ROS:  All others reviewed and negative.  Objective        PE:  BP 130/82 (BP Location: Right Arm, Patient Position: Sitting, Cuff Size: Normal)   Pulse 97   Temp 98.9 F (37.2 C) (Oral)   Ht 5\' 8"  (1.727 m)   Wt 198 lb (89.8 kg)   SpO2 94%   BMI 30.11 kg/m                 Constitutional: Pt appears mild ill               HENT: Head: NCAT.                Right Ear: External ear normal.  Left Ear: External ear normal. Bilat tm's with mild erythema.  Max sinus areas non tender.  Pharynx with mild erythema, no exudate               Eyes: . Pupils are equal, round, and reactive to light. Conjunctivae and EOM are normal               Nose: without d/c or deformity               Neck: Neck supple. Gross normal ROM               Cardiovascular: Normal rate and regular rhythm.                 Pulmonary/Chest: Effort normal and breath sounds decresaed without rales or wheezing.                              Neurological: Pt is alert. At baseline orientation, motor grossly intact               Skin: Skin is warm. No rashes, no other new lesions, LE edema - none               Psychiatric: Pt behavior is normal without agitation   Micro: none  Cardiac tracings I have personally interpreted today:  none  Pertinent Radiological findings (summarize): none   Lab Results  Component Value Date   WBC 7.7 09/07/2022   HGB 13.8 09/07/2022   HCT 40.6 09/07/2022   PLT 287.0 09/07/2022   GLUCOSE 90 09/07/2022   CHOL 228 (H) 09/07/2022   TRIG 361.0 (H) 09/07/2022   HDL 35.70 (L) 09/07/2022   LDLDIRECT 156.0 09/07/2022   LDLCALC 151  (H) 05/28/2019   ALT 25 09/07/2022   AST 22 09/07/2022   NA 141 09/07/2022   K 4.0 09/07/2022   CL 104 09/07/2022   CREATININE 1.12 09/07/2022   BUN 16 09/07/2022   CO2 29 09/07/2022   TSH 1.43 09/07/2022   PSA 0.79 09/07/2022   POCT - COVID - neg, Flu A - Positive  Assessment/Plan:  Adam [name] is a 52 y.o. White or Caucasian [1] male with  has a past medical history of Anxiety, GERD (gastroesophageal reflux disease), Hiatal hernia, and Kidney stones.  Influenza A Mild to mod, for antibx course tamiflu, cough med prn, prednisone taper, inhaler prn,  to f/u any worsening symptoms or concerns  Primary hypertension BP Readings from Last 3 Encounters:  02/21/23 130/82  11/01/22 120/82  10/12/22 101/69   Stable, pt to continue medical treatment benicar 20 qd  Followup: Return if symptoms worsen or fail to improve.  Oliver Barre, MD 02/22/2023 6:49 PM [facility]

## 2023-02-21 NOTE — Patient Instructions (Signed)
Your test was positive for Influenza A today  Please take all new medication as prescribed  - the antibiotic (tamiflu), cough medicine, prednisone, and inhaler as needed  Please continue all other medications as before, and refills have been done if requested.  Please have the pharmacy call with any other refills you may need.  Please keep your appointments with your specialists as you may have planned

## 2023-02-22 ENCOUNTER — Encounter: Payer: Self-pay | Admitting: Internal Medicine

## 2023-02-22 DIAGNOSIS — J101 Influenza due to other identified influenza virus with other respiratory manifestations: Secondary | ICD-10-CM | POA: Insufficient documentation

## 2023-02-22 NOTE — Assessment & Plan Note (Signed)
BP Readings from Last 3 Encounters:  02/21/23 130/82  11/01/22 120/82  10/12/22 101/69   Stable, pt to continue medical treatment benicar 20 qd

## 2023-02-22 NOTE — Assessment & Plan Note (Signed)
Mild to mod, for antibx course tamiflu, cough med prn, prednisone taper, inhaler prn,  to f/u any worsening symptoms or concerns

## 2023-03-11 ENCOUNTER — Other Ambulatory Visit: Payer: Self-pay | Admitting: Internal Medicine

## 2023-03-13 ENCOUNTER — Encounter: Payer: Self-pay | Admitting: Internal Medicine

## 2023-03-13 ENCOUNTER — Ambulatory Visit: Payer: No Typology Code available for payment source | Admitting: Internal Medicine

## 2023-03-13 ENCOUNTER — Other Ambulatory Visit: Payer: Self-pay

## 2023-03-13 VITALS — BP 124/78 | HR 90 | Temp 98.4°F | Resp 16 | Ht 68.0 in | Wt 200.2 lb

## 2023-03-13 DIAGNOSIS — I10 Essential (primary) hypertension: Secondary | ICD-10-CM

## 2023-03-13 DIAGNOSIS — E785 Hyperlipidemia, unspecified: Secondary | ICD-10-CM

## 2023-03-13 DIAGNOSIS — R0609 Other forms of dyspnea: Secondary | ICD-10-CM | POA: Insufficient documentation

## 2023-03-13 MED ORDER — ATORVASTATIN CALCIUM 20 MG PO TABS: 20.0000 mg | ORAL_TABLET | Freq: Every day | ORAL | 1 refills | Status: DC

## 2023-03-13 NOTE — Progress Notes (Unsigned)
Subjective:  Patient ID: Adam [name], male    DOB: [birth year]  Age: 52 y.o. MRN: [id number]  CC: Hypertension and Hyperlipidemia   HPI Adam [name] presents for f/up ---   Outpatient Medications Prior to Visit  Medication Sig Dispense Refill   loperamide (IMODIUM) 2 MG capsule Take 8 mg by mouth daily.     loratadine (CLARITIN) 10 MG tablet Take 10 mg by mouth daily.     Multiple Vitamin (MULTIVITAMIN PO) Take by mouth.     naproxen sodium (ALEVE) 220 MG tablet Take 440 mg by mouth daily.     olmesartan (BENICAR) 20 MG tablet TAKE 1 TABLET BY MOUTH EVERY DAY 30 tablet 2   omeprazole (PRILOSEC) 20 MG capsule Take 20 mg by mouth daily.     triamcinolone cream (KENALOG) 0.1 % PLEASE SEE ATTACHED FOR DETAILED DIRECTIONS     albuterol (VENTOLIN HFA) 108 (90 Base) MCG/ACT inhaler TAKE 2 PUFFS BY MOUTH EVERY 6 HOURS AS NEEDED FOR WHEEZE OR SHORTNESS OF BREATH 18 each 0   oseltamivir (TAMIFLU) 75 MG capsule Take 1 capsule (75 mg total) by mouth 2 (two) times daily. 10 capsule 0   predniSONE (DELTASONE) 10 MG tablet 3 tabs by mouth per day for 3 days,2tabs per day for 3 days,1tab per day for 3 days 18 tablet 0   No facility-administered medications prior to visit.    ROS Review of Systems  Objective:  BP 124/78 (BP Location: Left Arm, Patient Position: Sitting, Cuff Size: Normal)   Pulse 90   Temp 98.4 F (36.9 C) (Oral)   Resp 16   Ht 5\' 8"  (1.727 m)   Wt 200 lb 3.2 oz (90.8 kg)   SpO2 94%   BMI 30.44 kg/m   BP Readings from Last 3 Encounters:  03/13/23 124/78  02/21/23 130/82  11/01/22 120/82    Wt Readings from Last 3 Encounters:  03/13/23 200 lb 3.2 oz (90.8 kg)  02/21/23 198 lb (89.8 kg)  11/01/22 200 lb (90.7 kg)    Physical Exam  Lab Results  Component Value Date   WBC 7.7 09/07/2022   HGB 13.8 09/07/2022   HCT 40.6 09/07/2022   PLT 287.0 09/07/2022   GLUCOSE 90 09/07/2022   CHOL 228 (H) 09/07/2022   TRIG 361.0 (H) 09/07/2022   HDL 35.70 (L) 09/07/2022    LDLDIRECT 156.0 09/07/2022   LDLCALC 151 (H) 05/28/2019   ALT 25 09/07/2022   AST 22 09/07/2022   NA 141 09/07/2022   K 4.0 09/07/2022   CL 104 09/07/2022   CREATININE 1.12 09/07/2022   BUN 16 09/07/2022   CO2 29 09/07/2022   TSH 1.43 09/07/2022   PSA 0.79 09/07/2022    DG Abd 1 View  Result Date: 01/03/2011 *RADIOLOGY REPORT* Clinical Data: Lithotripsy ABDOMEN - 1 VIEW Comparison: 12/23/2010 Findings: Bilateral nephrolithiasis is stable.  Stool in the ascending and proximal transverse colon is stable.  No disproportionate dilatation of bowel.  Unremarkable bony framework. IMPRESSION: Bilateral nephrolithiasis. Original Report Authenticated By: Donavan Burnet, M.D.   Assessment & Plan:  Dyslipidemia, goal LDL below 100 -     Atorvastatin Calcium; Take 1 tablet (20 mg total) by mouth daily.  Dispense: 90 tablet; Refill: 1 -     CT CARDIAC SCORING (SELF PAY ONLY); Future  Primary hypertension -     CT CARDIAC SCORING (SELF PAY ONLY); Future  DOE (dyspnea on exertion) -     CT CARDIAC SCORING (SELF PAY ONLY);  Future     Follow-up: No follow-ups on file.  Sanda Linger, MD

## 2023-03-13 NOTE — Patient Instructions (Signed)

## 2023-04-07 ENCOUNTER — Ambulatory Visit
Admission: RE | Admit: 2023-04-07 | Discharge: 2023-04-07 | Disposition: A | Discharge status: Home / Self Care | Payer: No Typology Code available for payment source | Source: Ambulatory Visit | Attending: Internal Medicine | Admitting: Internal Medicine

## 2023-04-07 DIAGNOSIS — I10 Essential (primary) hypertension: Secondary | ICD-10-CM

## 2023-04-07 DIAGNOSIS — E785 Hyperlipidemia, unspecified: Secondary | ICD-10-CM

## 2023-04-07 DIAGNOSIS — R0609 Other forms of dyspnea: Secondary | ICD-10-CM

## 2023-04-19 ENCOUNTER — Other Ambulatory Visit: Payer: Self-pay | Admitting: Internal Medicine

## 2023-04-19 DIAGNOSIS — I10 Essential (primary) hypertension: Secondary | ICD-10-CM

## 2023-07-05 ENCOUNTER — Ambulatory Visit (INDEPENDENT_AMBULATORY_CARE_PROVIDER_SITE_OTHER): Payer: Self-pay | Admitting: Emergency Medicine

## 2023-07-05 ENCOUNTER — Encounter: Payer: Self-pay | Admitting: Emergency Medicine

## 2023-07-05 VITALS — BP 128/88 | HR 87 | Temp 98.6°F | Ht 68.0 in | Wt 200.0 lb

## 2023-07-05 DIAGNOSIS — K649 Unspecified hemorrhoids: Secondary | ICD-10-CM | POA: Insufficient documentation

## 2023-07-05 MED ORDER — HYDROCORTISONE ACETATE 25 MG RE SUPP: 25.0000 mg | Freq: Two times a day (BID) | RECTAL | 0 refills | Status: DC

## 2023-07-05 MED ORDER — HYDROCORTISONE (PERIANAL) 2.5 % EX CREA: 1.0000 | TOPICAL_CREAM | Freq: Two times a day (BID) | CUTANEOUS | 0 refills | Status: DC

## 2023-07-05 NOTE — Patient Instructions (Signed)
 Hemorrhoids Hemorrhoids are swollen veins that may form: In the butt (rectum). These are called internal hemorrhoids. Around the opening of the butt (anus). These are called external hemorrhoids. Most hemorrhoids do not cause very bad problems. They often get better with changes to your lifestyle and what you eat. What are the causes? Having trouble pooping (constipation) or watery poop (diarrhea). Pushing too hard when you poop. Pregnancy. Being very overweight (obese). Sitting for too long. Riding a bike for a long time. Heavy lifting or other things that take a lot of effort. Anal sex. What are the signs or symptoms? Pain. Itching or soreness in the butt. Bleeding from the butt. Leaking poop. Swelling. One or more lumps around the opening of your butt. How is this treated? In most cases, hemorrhoids can be treated at home. You may be told to: Change what you eat. Make changes to your lifestyle. If these treatments do not help, you may need to have a procedure done. Your doctor may need to: Place rubber bands at the bottom of the hemorrhoids to make them fall off. Put medicine into the hemorrhoids to shrink them. Shine a type of light on the hemorrhoids to cause them to fall off. Do surgery to get rid of the hemorrhoids. Follow these instructions at home: Medicines Take over-the-counter and prescription medicines only as told by your doctor. Use creams with medicine in them or medicines that you put in your butt as told by your doctor. Eating and drinking  Eat foods that have a lot of fiber in them. These include whole grains, beans, nuts, fruits, and vegetables. Ask your doctor about taking products that have fiber added to them (fibersupplements). Take in less fat. You can do this by: Eating low-fat dairy products. Eating less red meat. Staying away from processed foods. Drink enough fluid to keep your pee (urine) pale yellow. Managing pain and swelling  Take a  warm-water bath (sitz bath) for 20 minutes to ease pain. Do this 3-4 times a day. You may do this in a bathtub. You may also use a portable sitz bath that fits over the toilet. If told, put ice on the painful area. It may help to use ice between your warm baths. Put ice in a plastic bag. Place a towel between your skin and the bag. Leave the ice on for 20 minutes, 2-3 times a day. If your skin turns bright red, take off the ice right away to prevent skin damage. The risk of damage is higher if you cannot feel pain, heat, or cold. General instructions Exercise. Ask your doctor how much and what kind of exercise is best for you. Go to the bathroom when you need to poop. Do not wait. Try not to push too hard when you poop. Keep your butt dry and clean. Use wet toilet paper or moist towelettes after you poop. Do not sit on the toilet for a long time. Contact a doctor if: You have pain and swelling that do not get better with treatment. You have trouble pooping. You cannot poop. You have pain or swelling outside the area of the hemorrhoids. Get help right away if: You have bleeding from the butt that will not stop. This information is not intended to replace advice given to you by your health care provider. Make sure you discuss any questions you have with your health care provider. Document Revised: 12/08/2021 Document Reviewed: 12/08/2021 Elsevier Patient Education  2024 [employer].

## 2023-07-05 NOTE — Progress Notes (Signed)
 Adam [name] 53 y.o.   Chief Complaint  Patient presents with   Hemorrhoids    Patient states he had them for years but within that last 3 weeks it has been more painful. Does bleed with wiping and does have some pus as well.     HISTORY OF PRESENT ILLNESS: Acute problem visit today.  Patient of Dr. Sanda Linger. This is a 53 y.o. male complaining of painful hemorrhoid for the last 3 weeks.  Intermittent bleeding. History of hemorrhoids for the last couple years. No other complaints or medical concerns today.  HPI   Prior to Admission medications   Medication Sig Start Date End Date Taking? Authorizing Provider  atorvastatin (LIPITOR) 20 MG tablet Take 1 tablet (20 mg total) by mouth daily. 03/13/23  Yes Etta Grandchild, MD  hydrocortisone (ANUSOL-HC) 2.5 % rectal cream Place 1 Application rectally 2 (two) times daily. 07/05/23  Yes Maisy Newport, Eilleen Kempf, MD  hydrocortisone (ANUSOL-HC) 25 MG suppository Place 1 suppository (25 mg total) rectally 2 (two) times daily. 07/05/23  Yes Karina Nofsinger, Eilleen Kempf, MD  loperamide (IMODIUM) 2 MG capsule Take 8 mg by mouth daily.   Yes [provider]  loratadine (CLARITIN) 10 MG tablet Take 10 mg by mouth daily.   Yes [provider]  Multiple Vitamin (MULTIVITAMIN PO) Take by mouth.   Yes [provider]  naproxen sodium (ALEVE) 220 MG tablet Take 440 mg by mouth daily.   Yes [provider]  olmesartan (BENICAR) 20 MG tablet TAKE 1 TABLET BY MOUTH EVERY DAY 04/19/23  Yes Etta Grandchild, MD  omeprazole (PRILOSEC) 20 MG capsule Take 20 mg by mouth daily.   Yes [provider]  triamcinolone cream (KENALOG) 0.1 % PLEASE SEE ATTACHED FOR DETAILED DIRECTIONS 02/09/23  Yes [provider]    Allergies  Allergen Reactions   Iodinated Contrast Media Hives, Swelling and Anaphylaxis   Duloxetine Hcl     Other Reaction(s): drowsy   Fluoxetine     Other Reaction(s): zombie, sexual dysfunction    Morphine     Other Reaction(s): HA, nausea    Patient Active Problem List   Diagnosis Date Noted   Acute hemorrhoid 07/05/2023   DOE (dyspnea on exertion) 03/13/2023   Dyslipidemia, goal LDL below 100 09/07/2022   OSA on CPAP 03/07/2022   Nephrolithiasis 06/08/2021   Encounter for general adult medical examination with abnormal findings 06/02/2021   Primary hypertension 06/02/2021    Past Medical History:  Diagnosis Date   Anxiety    GERD (gastroesophageal reflux disease)    Hiatal hernia    Kidney stones     History reviewed. No pertinent surgical history.  Social History   Socioeconomic History   Marital status: Single    Spouse name: Not on file   Number of children: Not on file   Years of education: Not on file   Highest education level: Bachelor's degree (e.g., BA, AB, BS)  Occupational History   Occupation: REGISTERED NURSE    Employer: [employer]  Tobacco Use   Smoking status: Never   Smokeless tobacco: Never  Vaping Use   Vaping status: Never Used  Substance and Sexual Activity   Alcohol use: Not Currently    Alcohol/week: 3.0 standard drinks of alcohol    Types: 3 Standard drinks or equivalent per week   Drug use: Never   Sexual activity: Yes    Partners: Male    Birth control/protection: None  Other Topics  Concern   Not on file  Social History Narrative   Not on file   Social Drivers of Health   Financial Resource Strain: Not on file  Food Insecurity: Not on file  Transportation Needs: Not on file  Physical Activity: Not on file  Stress: Not on file  Social Connections: Not on file  Intimate Partner Violence: Not on file    Family History  Problem Relation Age of Onset   Sleep apnea Mother    Hypertension Father      Review of Systems  Constitutional: Negative.   HENT: Negative.  Negative for congestion and sore throat.   Respiratory: Negative.  Negative for cough and shortness of breath.    Cardiovascular: Negative.  Negative for chest pain and palpitations.  Gastrointestinal:  Negative for abdominal pain, diarrhea, nausea and vomiting.  Genitourinary: Negative.  Negative for dysuria and hematuria.  Skin: Negative.   Neurological: Negative.  Negative for dizziness and headaches.  All other systems reviewed and are negative.   Vitals:   07/05/23 1309  BP: 128/88  Pulse: 87  Temp: 98.6 F (37 C)  SpO2: 95%    Physical Exam Vitals reviewed.  Constitutional:      Appearance: Normal appearance.  HENT:     Head: Normocephalic.  Eyes:     Extraocular Movements: Extraocular movements intact.  Cardiovascular:     Rate and Rhythm: Normal rate.  Pulmonary:     Effort: Pulmonary effort is normal.  Abdominal:     Palpations: Abdomen is soft.     Tenderness: There is no abdominal tenderness.  Genitourinary:    Rectum: External hemorrhoid present.  Musculoskeletal:     Cervical back: No tenderness.  Lymphadenopathy:     Cervical: No cervical adenopathy.  Skin:    General: Skin is warm and dry.  Neurological:     Mental Status: He is alert and oriented to person, place, and time.  Psychiatric:        Mood and Affect: Mood normal.        Behavior: Behavior normal.      ASSESSMENT & PLAN: A total of 32 minutes was spent with the patient and counseling/coordination of care regarding preparing for this visit, review of most recent office visit notes, review of chronic medical conditions under management, review of all medications, diagnosis of hemorrhoids and management, pain management, prognosis, documentation and possible need for referral to colorectal surgeon.  Problem List Items Addressed This Visit       Cardiovascular and Mediastinum   Acute hemorrhoid - Primary   Currently inflamed and very painful No thrombosis on physical exam No active bleeding.  No signs of infection. Recommend to start using both Anusol HC cream and suppository twice a day. Warm  sits baths recommended May need referral to colorectal surgeon. Pain management discussed.  May take Tylenol and or Advil as needed for pain.      Relevant Medications   hydrocortisone (ANUSOL-HC) 25 MG suppository   hydrocortisone (ANUSOL-HC) 2.5 % rectal cream   Patient Instructions  Hemorrhoids Hemorrhoids are swollen veins that may form: In the butt (rectum). These are called internal hemorrhoids. Around the opening of the butt (anus). These are called external hemorrhoids. Most hemorrhoids do not cause very bad problems. They often get better with changes to your lifestyle and what you eat. What are the causes? Having trouble pooping (constipation) or watery poop (diarrhea). Pushing too hard when you poop. Pregnancy. Being very overweight (obese). Sitting for too  long. Riding a bike for a long time. Heavy lifting or other things that take a lot of effort. Anal sex. What are the signs or symptoms? Pain. Itching or soreness in the butt. Bleeding from the butt. Leaking poop. Swelling. One or more lumps around the opening of your butt. How is this treated? In most cases, hemorrhoids can be treated at home. You may be told to: Change what you eat. Make changes to your lifestyle. If these treatments do not help, you may need to have a procedure done. Your doctor may need to: Place rubber bands at the bottom of the hemorrhoids to make them fall off. Put medicine into the hemorrhoids to shrink them. Shine a type of light on the hemorrhoids to cause them to fall off. Do surgery to get rid of the hemorrhoids. Follow these instructions at home: Medicines Take over-the-counter and prescription medicines only as told by your doctor. Use creams with medicine in them or medicines that you put in your butt as told by your doctor. Eating and drinking  Eat foods that have a lot of fiber in them. These include whole grains, beans, nuts, fruits, and vegetables. Ask your doctor about  taking products that have fiber added to them (fibersupplements). Take in less fat. You can do this by: Eating low-fat dairy products. Eating less red meat. Staying away from processed foods. Drink enough fluid to keep your pee (urine) pale yellow. Managing pain and swelling  Take a warm-water bath (sitz bath) for 20 minutes to ease pain. Do this 3-4 times a day. You may do this in a bathtub. You may also use a portable sitz bath that fits over the toilet. If told, put ice on the painful area. It may help to use ice between your warm baths. Put ice in a plastic bag. Place a towel between your skin and the bag. Leave the ice on for 20 minutes, 2-3 times a day. If your skin turns bright red, take off the ice right away to prevent skin damage. The risk of damage is higher if you cannot feel pain, heat, or cold. General instructions Exercise. Ask your doctor how much and what kind of exercise is best for you. Go to the bathroom when you need to poop. Do not wait. Try not to push too hard when you poop. Keep your butt dry and clean. Use wet toilet paper or moist towelettes after you poop. Do not sit on the toilet for a long time. Contact a doctor if: You have pain and swelling that do not get better with treatment. You have trouble pooping. You cannot poop. You have pain or swelling outside the area of the hemorrhoids. Get help right away if: You have bleeding from the butt that will not stop. This information is not intended to replace advice given to you by your health care provider. Make sure you discuss any questions you have with your health care provider. Document Revised: 12/08/2021 Document Reviewed: 12/08/2021 Elsevier Patient Education  2024 Elsevier Inc.     Edwina Barth, MD [facility]

## 2023-07-05 NOTE — Assessment & Plan Note (Signed)
 Currently inflamed and very painful No thrombosis on physical exam No active bleeding.  No signs of infection. Recommend to start using both Anusol HC cream and suppository twice a day. Warm sits baths recommended May need referral to colorectal surgeon. Pain management discussed.  May take Tylenol and or Advil as needed for pain.

## 2023-07-11 ENCOUNTER — Ambulatory Visit: Payer: Self-pay | Admitting: Nurse Practitioner

## 2023-07-11 ENCOUNTER — Encounter: Payer: Self-pay | Admitting: Nurse Practitioner

## 2023-07-11 VITALS — BP 126/78 | HR 96 | Ht 68.0 in | Wt 201.2 lb

## 2023-07-11 DIAGNOSIS — Z789 Other specified health status: Secondary | ICD-10-CM

## 2023-07-11 NOTE — Progress Notes (Signed)
 Occupational [facility]- [facility]  Subjective:  Patient ID: Adam [name], male    DOB: 1970-12-30  Age: 53 y.o. MRN: [id number]  CC: wellness exam   HPI LAQUINN SHIPPY presents for wellness exam visit for insurance benefit.  Patient has a PCP: Dr. Yetta Barre PMH significant for: OSA, now using cpap, HTN  Last labs per PCP were completed: May 2024  Health Maintenance:  Colonoscopy: Due 2028  PSA: 0.79 in 2024    Smoker:Never   Immunizations:  Shingrix- compelted  Flu-yealry  Tdap- 2023  Lifestyle: Diet- limits sodium.  Exercise- active at work.     Past Medical History:  Diagnosis Date   Anxiety    GERD (gastroesophageal reflux disease)    Hiatal hernia    Kidney stones     No past surgical history on file.  Outpatient Medications Prior to Visit  Medication Sig Dispense Refill   atorvastatin (LIPITOR) 20 MG tablet Take 1 tablet (20 mg total) by mouth daily. 90 tablet 1   hydrocortisone (ANUSOL-HC) 2.5 % rectal cream Place 1 Application rectally 2 (two) times daily. 30 g 0   hydrocortisone (ANUSOL-HC) 25 MG suppository Place 1 suppository (25 mg total) rectally 2 (two) times daily. 12 suppository 0   loperamide (IMODIUM) 2 MG capsule Take 8 mg by mouth daily.     loratadine (CLARITIN) 10 MG tablet Take 10 mg by mouth daily.     Multiple Vitamin (MULTIVITAMIN PO) Take by mouth.     naproxen sodium (ALEVE) 220 MG tablet Take 440 mg by mouth daily.     olmesartan (BENICAR) 20 MG tablet TAKE 1 TABLET BY MOUTH EVERY DAY 30 tablet 2   omeprazole (PRILOSEC) 20 MG capsule Take 20 mg by mouth daily.     triamcinolone cream (KENALOG) 0.1 % PLEASE SEE ATTACHED FOR DETAILED DIRECTIONS     No facility-administered medications prior to visit.    ROS Review of Systems  HENT:  Positive for hearing loss.   Eyes:  Negative for visual disturbance.  Respiratory:  Negative for shortness of breath.   Cardiovascular:  Negative for chest pain.  Gastrointestinal:  Positive for diarrhea.   Musculoskeletal:  Negative for arthralgias and back pain.  Neurological:  Negative for headaches.  Psychiatric/Behavioral:  Negative for sleep disturbance.     Objective:  BP 126/78 (BP Location: Left Arm, Patient Position: Sitting, Cuff Size: Normal)   Pulse 96   Ht 5\' 8"  (1.727 m)   Wt 201 lb 3.2 oz (91.3 kg)   SpO2 98%   BMI 30.59 kg/m   Physical Exam Constitutional:      General: He is not in acute distress. HENT:     Head: Normocephalic.     Right Ear: Tympanic membrane, ear canal and external ear normal.     Left Ear: Tympanic membrane, ear canal and external ear normal.     Nose: Nose normal.     Mouth/Throat:     Mouth: Mucous membranes are moist.     Pharynx: No oropharyngeal exudate.  Eyes:     Pupils: Pupils are equal, round, and reactive to light.  Cardiovascular:     Rate and Rhythm: Normal rate and regular rhythm.     Heart sounds: Normal heart sounds.  Pulmonary:     Effort: Pulmonary effort is normal.     Breath sounds: Normal breath sounds.  Abdominal:     General: Abdomen is flat.     Palpations: Abdomen is soft.  Musculoskeletal:  General: Normal range of motion.  Skin:    General: Skin is warm.  Neurological:     General: No focal deficit present.     Mental Status: He is alert and oriented to person, place, and time.  Psychiatric:        Mood and Affect: Mood normal.        Behavior: Behavior normal.      Assessment & Plan:    Lemmie was seen today for wellness exam.  Diagnoses and all orders for this visit:  Participant in health and wellness plan Adult wellness physical was conducted today. Importance of diet and exercise were discussed in detail.  We reviewed immunizations and gave recommendations regarding current immunization needed for age.  Preventative health exams are up to date.   Patient was advised yearly wellness exam and follow-up with PCP as needed  No orders of the defined types were placed in this  encounter.   No orders of the defined types were placed in this encounter.   Follow-up: as needed.

## 2023-07-20 ENCOUNTER — Ambulatory Visit: Payer: Self-pay | Admitting: Internal Medicine

## 2023-07-20 ENCOUNTER — Encounter: Payer: Self-pay | Admitting: Internal Medicine

## 2023-07-20 VITALS — BP 126/84 | HR 84 | Temp 98.2°F | Resp 16 | Ht 68.0 in | Wt 200.0 lb

## 2023-07-20 DIAGNOSIS — K648 Other hemorrhoids: Secondary | ICD-10-CM

## 2023-07-20 DIAGNOSIS — I10 Essential (primary) hypertension: Secondary | ICD-10-CM

## 2023-07-20 DIAGNOSIS — K600 Acute anal fissure: Secondary | ICD-10-CM | POA: Insufficient documentation

## 2023-07-20 MED ORDER — NITROGLYCERIN 0.4 % RE OINT: 1.0000 [in_us] | TOPICAL_OINTMENT | Freq: Two times a day (BID) | RECTAL | 2 refills | Status: DC

## 2023-07-20 NOTE — Patient Instructions (Signed)
Anal Fissure, Adult  An anal fissure is a small tear or crack in the tissue near the opening of the butt (anus). In most cases, bleeding from a fissure stops on its own within a few minutes. You may have bleeding each time you poop until the fissure heals. What are the causes? An anal fissure may be caused by large or hard poop (stool). Other causes include: Having trouble pooping (constipation). Getting diarrhea a lot. Inflammatory bowel disease, such as Crohn's disease or ulcerative colitis. Childbirth. Infections. Anal sex. What are the signs or symptoms? Symptoms of an anal fissure include: Bleeding from the rectum. Small amounts of blood seen on your poop, on the toilet paper, or in the toilet after you poop. The blood coats the outside of the poop. It is not mixed with the poop. Pain when you poop. Itching or irritation around the anus. How is this diagnosed? A health care provider may diagnose a fissure by looking closely at the anal area. In some cases, a rectal exam may be done or a short tube (anoscope) may be used to look at the anal canal. How is this treated? Treatment for an anal fissure may include: Taking steps to avoid and treat constipation. Taking fiber supplements. These can help soften your poop. Taking sitz baths. These can help heal the tear. Using medicated creams or ointments. Doing physical therapy. This can help strengthen the area between your hip bones (pelvis). If other treatments do not work, you may need: Botulinum injections. Surgery to fix the fissure. Follow these instructions at home: Medicines Take or use over-the-counter and prescription medicines only as told by your provider. This includes medicated creams and ointments. Use supplements and medicines to make your poop soft (stool softeners) as told by your provider. Managing constipation You may need to take these actions to prevent or treat constipation: Drink enough fluid to keep your pee  (urine) pale yellow. Eat foods that are high in fiber, such as beans, whole grains, and fresh fruits and vegetables. Avoid unripe bananas. Ripe bananas may help if you feel constipated. Limit foods that are high in fat and processed sugars, such as fried or sweet foods. Avoid dairy products, such as milk.  General instructions  Keep the anal area clean and dry. Take sitz baths as told by your provider. Do not use soap in the sitz baths. Contact a health care provider if: You have more bleeding. You have a fever. You have diarrhea that is mixed with blood. Your pain does not go away. Your problems get worse rather than better. This information is not intended to replace advice given to you by your health care provider. Make sure you discuss any questions you have with your health care provider. Document Revised: 04/14/2022 Document Reviewed: 04/14/2022 Elsevier Patient Education  2024 [employer].

## 2023-07-20 NOTE — Progress Notes (Unsigned)
 Subjective:  Patient ID: Adam [name], male    DOB: [birth year]  Age: 53 y.o. MRN: [id number]  CC: Hypertension   HPI Adam [name] presents for f/up -----  Discussed the use of AI scribe software for clinical note transcription with the patient, who gave verbal consent to proceed.  History of Present Illness   Adam [name] is a 53 year old male who presents with hemorrhoidal pain and bleeding.  He has been experiencing hemorrhoidal pain for the past month. The pain is constant, with episodes of sharp, exquisite pain during bowel movements. Although the pain does not impede bowel movements, it is associated with bleeding. He also describes a tearing sensation during bowel movements, which he characterizes as 'sharp at times.'  For pain management, he has been using extra strength Tylenol at least three times a day. Two weeks ago, he was prescribed hydrocortisone 2.5% cream and hydrocortisone suppositories. However, he finds it difficult to use the suppositories during the day due to leakage, which results in staining his clothes.  No significant dizziness.       Outpatient Medications Prior to Visit  Medication Sig Dispense Refill   atorvastatin (LIPITOR) 20 MG tablet Take 1 tablet (20 mg total) by mouth daily. 90 tablet 1   hydrocortisone (ANUSOL-HC) 2.5 % rectal cream Place 1 Application rectally 2 (two) times daily. 30 g 0   hydrocortisone (ANUSOL-HC) 25 MG suppository Place 1 suppository (25 mg total) rectally 2 (two) times daily. 12 suppository 0   loperamide (IMODIUM) 2 MG capsule Take 8 mg by mouth daily.     loratadine (CLARITIN) 10 MG tablet Take 10 mg by mouth daily.     Multiple Vitamin (MULTIVITAMIN PO) Take by mouth.     naproxen sodium (ALEVE) 220 MG tablet Take 440 mg by mouth daily.     olmesartan (BENICAR) 20 MG tablet TAKE 1 TABLET BY MOUTH EVERY DAY 30 tablet 2   omeprazole (PRILOSEC) 20 MG capsule Take 20 mg by mouth daily.     triamcinolone cream (KENALOG) 0.1 %  PLEASE SEE ATTACHED FOR DETAILED DIRECTIONS     No facility-administered medications prior to visit.    ROS Review of Systems  Objective:  BP 126/84 (BP Location: Left Arm, Patient Position: Sitting, Cuff Size: Normal)   Pulse 84   Temp 98.2 F (36.8 C) (Oral)   Resp 16   Ht 5\' 8"  (1.727 m)   Wt 200 lb (90.7 kg)   SpO2 96%   BMI 30.41 kg/m   BP Readings from Last 3 Encounters:  07/20/23 126/84  07/11/23 126/78  07/05/23 128/88    Wt Readings from Last 3 Encounters:  07/20/23 200 lb (90.7 kg)  07/11/23 201 lb 3.2 oz (91.3 kg)  07/05/23 200 lb (90.7 kg)    Physical Exam Vitals reviewed.  Constitutional:      Appearance: Normal appearance.  HENT:     Mouth/Throat:     Mouth: Mucous membranes are moist.  Eyes:     General: No scleral icterus.    Conjunctiva/sclera: Conjunctivae normal.  Cardiovascular:     Rate and Rhythm: Normal rate and regular rhythm.     Heart sounds: No murmur heard. Pulmonary:     Breath sounds: No stridor. No wheezing, rhonchi or rales.  Abdominal:     General: Abdomen is flat.     Palpations: There is no mass.     Tenderness: There is no abdominal tenderness. There is no guarding.  Hernia: No hernia is present.  Genitourinary:    Prostate: Not enlarged, not tender and no nodules present.     Rectum: Guaiac result positive. Tenderness, anal fissure, external hemorrhoid and internal hemorrhoid present. No mass. Normal anal tone.       Comments: Small , uncomplicated external anal hemorrhoid Musculoskeletal:        General: Normal range of motion.     Cervical back: Neck supple.     Right lower leg: No edema.     Left lower leg: No edema.  Lymphadenopathy:     Cervical: No cervical adenopathy.  Skin:    General: Skin is warm and dry.  Neurological:     General: No focal deficit present.     Mental Status: He is alert. Mental status is at baseline.  Psychiatric:        Mood and Affect: Mood normal.        Behavior: Behavior  normal.     Lab Results  Component Value Date   WBC 7.7 09/07/2022   HGB 13.8 09/07/2022   HCT 40.6 09/07/2022   PLT 287.0 09/07/2022   GLUCOSE 90 09/07/2022   CHOL 228 (H) 09/07/2022   TRIG 361.0 (H) 09/07/2022   HDL 35.70 (L) 09/07/2022   LDLDIRECT 156.0 09/07/2022   LDLCALC 151 (H) 05/28/2019   ALT 25 09/07/2022   AST 22 09/07/2022   NA 141 09/07/2022   K 4.0 09/07/2022   CL 104 09/07/2022   CREATININE 1.12 09/07/2022   BUN 16 09/07/2022   CO2 29 09/07/2022   TSH 1.43 09/07/2022   PSA 0.79 09/07/2022    CT CARDIAC SCORING ([facility] LOCATIONS ONLY) Result Date: 04/08/2023 CLINICAL DATA:  53 year old Caucasian male with history of hypertension and high cholesterol. Evaluate for coronary artery disease. * Tracking Code: FCC * EXAM: CT CARDIAC CORONARY ARTERY CALCIUM SCORE TECHNIQUE: Non-contrast imaging through the heart was performed using prospective ECG gating. Image post processing was performed on an independent workstation, allowing for quantitative analysis of the heart and coronary arteries. Note that this exam targets the heart and the chest was not imaged in its entirety. COMPARISON:  No priors. FINDINGS: CORONARY CALCIUM SCORES: Left Main: 0 LAD: 8 LCx: 0 RCA/PDA: 0 Total Agatston Score: 8 MESA database percentile: 54th AORTA MEASUREMENTS: Ascending Aorta: 3.1 cm Descending Aorta:2.7 cm OTHER FINDINGS: Within the visualized portions of the thorax there are no suspicious appearing pulmonary nodules or masses, there is no acute consolidative airspace disease, no pleural effusions, no pneumothorax and no lymphadenopathy. Visualized portions of the upper abdomen are unremarkable. There are no aggressive appearing lytic or blastic lesions noted in the visualized portions of the skeleton. IMPRESSION: 1. Patient's total coronary artery calcium score is 8 which is 54th percentile for patient's of matched age, gender and race/ethnicity. Please note that although the presence of coronary  artery calcium documents the presence of coronary artery disease, the severity of this disease and any potential stenosis cannot be assessed on this noncontrast CT examination. Assessment for potential risk factor modification, dietary therapy or pharmacologic therapy may be warranted, if clinically indicated. 2. No significant incidental noncardiac findings are noted. Electronically Signed   By: Trudie Reed M.D.   On: 04/08/2023 08:50    Assessment & Plan:  Acute anterior anal fissure -     Nitroglycerin; Place 1 inch rectally in the morning and at bedtime.  Dispense: 30 g; Refill: 2 -     Ambulatory referral to General Surgery  Internal  hemorrhoids with complication -     Nitroglycerin; Place 1 inch rectally in the morning and at bedtime.  Dispense: 30 g; Refill: 2  Primary hypertension     Follow-up: Return if symptoms worsen or fail to improve.  Sanda Linger, MD

## 2023-07-30 ENCOUNTER — Other Ambulatory Visit: Payer: Self-pay | Admitting: Internal Medicine

## 2023-07-30 DIAGNOSIS — I10 Essential (primary) hypertension: Secondary | ICD-10-CM

## 2023-08-02 ENCOUNTER — Ambulatory Visit: Payer: Self-pay

## 2023-08-02 NOTE — Telephone Encounter (Signed)
 [name], he is requesting an update on refill requested on 07/30/23 that is not at pharmacy.   Copied from CRM [id number]. Topic: Clinical - Prescription Issue >> Aug 02, 2023  2:37 PM Adam [name] wrote: Reason for CRM: Patient calling in regarding a medication (olmestartan medozomil) that was sent in via pharmacy. It has been 3 days and still no update. Patient is completely out now. He stated that this medication is for his blood pressure. He would like to be updated as soon as possible. Reason for Disposition  [1] Prescription refill request for NON-ESSENTIAL medicine (i.e., no harm to patient if med not taken) AND [2] triager unable to refill per department policy  Protocols used: Medication Refill and Renewal Call-A-AH

## 2023-08-03 ENCOUNTER — Other Ambulatory Visit: Payer: Self-pay

## 2023-08-03 DIAGNOSIS — I10 Essential (primary) hypertension: Secondary | ICD-10-CM

## 2023-08-03 MED ORDER — OLMESARTAN MEDOXOMIL 20 MG PO TABS: 20.0000 mg | ORAL_TABLET | Freq: Every day | ORAL | 1 refills | Status: DC

## 2023-08-03 NOTE — Telephone Encounter (Signed)
 Copied from CRM [id number]. Topic: Clinical - Prescription Issue >> Aug 02, 2023  2:37 PM [place] J wrote: Reason for CRM: Patient calling in regarding a medication (olmestartan medozomil) that was sent in via pharmacy. It has been 3 days and still no update. Patient is completely out now. He stated that this medication is for his blood pressure. He would like to be updated as soon as possible. >> Aug 02, 2023  5:02 PM Clyde Darling P wrote: Pt returning the call

## 2023-08-03 NOTE — Telephone Encounter (Signed)
Medication has been refilled. Patient is aware. 

## 2023-08-21 DIAGNOSIS — K580 Irritable bowel syndrome with diarrhea: Secondary | ICD-10-CM | POA: Insufficient documentation

## 2023-09-11 ENCOUNTER — Encounter: Payer: Self-pay | Admitting: Internal Medicine

## 2023-09-11 ENCOUNTER — Ambulatory Visit (INDEPENDENT_AMBULATORY_CARE_PROVIDER_SITE_OTHER): Payer: Self-pay | Admitting: Internal Medicine

## 2023-09-11 VITALS — BP 126/76 | HR 99 | Temp 98.3°F | Resp 16 | Ht 68.0 in | Wt 201.8 lb

## 2023-09-11 DIAGNOSIS — Z0000 Encounter for general adult medical examination without abnormal findings: Secondary | ICD-10-CM | POA: Diagnosis not present

## 2023-09-11 DIAGNOSIS — R1310 Dysphagia, unspecified: Secondary | ICD-10-CM | POA: Insufficient documentation

## 2023-09-11 DIAGNOSIS — K219 Gastro-esophageal reflux disease without esophagitis: Secondary | ICD-10-CM | POA: Diagnosis not present

## 2023-09-11 DIAGNOSIS — Z0001 Encounter for general adult medical examination with abnormal findings: Secondary | ICD-10-CM

## 2023-09-11 DIAGNOSIS — F419 Anxiety disorder, unspecified: Secondary | ICD-10-CM | POA: Insufficient documentation

## 2023-09-11 DIAGNOSIS — J309 Allergic rhinitis, unspecified: Secondary | ICD-10-CM | POA: Insufficient documentation

## 2023-09-11 DIAGNOSIS — E7800 Pure hypercholesterolemia, unspecified: Secondary | ICD-10-CM | POA: Insufficient documentation

## 2023-09-11 DIAGNOSIS — K5730 Diverticulosis of large intestine without perforation or abscess without bleeding: Secondary | ICD-10-CM | POA: Insufficient documentation

## 2023-09-11 DIAGNOSIS — E785 Hyperlipidemia, unspecified: Secondary | ICD-10-CM | POA: Diagnosis not present

## 2023-09-11 DIAGNOSIS — L719 Rosacea, unspecified: Secondary | ICD-10-CM | POA: Insufficient documentation

## 2023-09-11 DIAGNOSIS — I10 Essential (primary) hypertension: Secondary | ICD-10-CM

## 2023-09-11 DIAGNOSIS — R030 Elevated blood-pressure reading, without diagnosis of hypertension: Secondary | ICD-10-CM | POA: Insufficient documentation

## 2023-09-11 LAB — CBC WITH DIFFERENTIAL/PLATELET
Basophils Absolute: 0 10*3/uL (ref 0.0–0.1)
Basophils Relative: 0.5 % (ref 0.0–3.0)
Eosinophils Absolute: 0.2 10*3/uL (ref 0.0–0.7)
Eosinophils Relative: 2.1 % (ref 0.0–5.0)
HCT: 39.4 % (ref 39.0–52.0)
Hemoglobin: 13.3 g/dL (ref 13.0–17.0)
Lymphocytes Relative: 26 % (ref 12.0–46.0)
Lymphs Abs: 2.4 10*3/uL (ref 0.7–4.0)
MCHC: 33.6 g/dL (ref 30.0–36.0)
MCV: 89.2 fl (ref 78.0–100.0)
Monocytes Absolute: 0.7 10*3/uL (ref 0.1–1.0)
Monocytes Relative: 7.7 % (ref 3.0–12.0)
Neutro Abs: 5.9 10*3/uL (ref 1.4–7.7)
Neutrophils Relative %: 63.7 % (ref 43.0–77.0)
Platelets: 275 10*3/uL (ref 150.0–400.0)
RBC: 4.42 Mil/uL (ref 4.22–5.81)
RDW: 12.9 % (ref 11.5–15.5)
WBC: 9.2 10*3/uL (ref 4.0–10.5)

## 2023-09-11 LAB — BASIC METABOLIC PANEL WITH GFR
BUN: 19 mg/dL (ref 6–23)
CO2: 28 meq/L (ref 19–32)
Calcium: 9.4 mg/dL (ref 8.4–10.5)
Chloride: 103 meq/L (ref 96–112)
Creatinine, Ser: 1.17 mg/dL (ref 0.40–1.50)
GFR: 70.32 mL/min (ref >60.00)
Glucose, Bld: 122 mg/dL — ABNORMAL HIGH (ref 70–99)
Potassium: 3.8 meq/L (ref 3.5–5.1)
Sodium: 141 meq/L (ref 135–145)

## 2023-09-11 LAB — HEPATIC FUNCTION PANEL
ALT: 21 U/L (ref 0–53)
AST: 19 U/L (ref 0–37)
Albumin: 4.5 g/dL (ref 3.5–5.2)
Alkaline Phosphatase: 78 U/L (ref 39–117)
Bilirubin, Direct: 0.1 mg/dL (ref 0.0–0.3)
Total Bilirubin: 0.4 mg/dL (ref 0.2–1.2)
Total Protein: 7.5 g/dL (ref 6.0–8.3)

## 2023-09-11 LAB — LIPID PANEL
Cholesterol: 178 mg/dL (ref 0–200)
HDL: 40.9 mg/dL (ref >39.00)
LDL Cholesterol: 73 mg/dL (ref 0–99)
NonHDL: 137.44
Total CHOL/HDL Ratio: 4
Triglycerides: 320 mg/dL — ABNORMAL HIGH (ref 0.0–149.0)
VLDL: 64 mg/dL — ABNORMAL HIGH (ref 0.0–40.0)

## 2023-09-11 LAB — PSA: PSA: 0.93 ng/mL (ref 0.10–4.00)

## 2023-09-11 NOTE — Progress Notes (Unsigned)
 Subjective:  Patient ID: Adam [name], male    DOB: [birth year]  Age: 53 y.o. MRN: [id number]  CC: Hyperlipidemia, Hypertension, and Annual Exam   HPI [name] presents for a CPX and f/up ---   Discussed the use of AI scribe software for clinical note transcription with the patient, who gave verbal consent to proceed.  History of Present Illness   Adam [name] is a 53 year old male who presents with anal fissure symptoms.  He has been experiencing anal fissure symptoms, including pain and drainage during bowel movements. Since starting treatment with compounded diltiazem ointment, there is a reduction in pain and drainage. However, he still experiences occasional bleeding during bowel movements and describes the drainage as serous.  He wears panty liners due to the drainage and occasional bleeding, stating that without them, the drainage would soak through his pants. This issue has caused him to return home on a few occasions.  No dizziness, lightheadedness, abdominal cramping, or other systemic symptoms. He is compliant with CPAP therapy and denies any side effects from his current medications, including a statin.       Outpatient Medications Prior to Visit  Medication Sig Dispense Refill   loperamide (IMODIUM) 2 MG capsule Take 8 mg by mouth daily.     loratadine (CLARITIN) 10 MG tablet Take 10 mg by mouth daily.     Multiple Vitamin (MULTIVITAMIN PO) Take by mouth.     naproxen sodium (ALEVE) 220 MG tablet Take 440 mg by mouth daily.     olmesartan  (BENICAR ) 20 MG tablet Take 1 tablet (20 mg total) by mouth daily. 90 tablet 1   omeprazole (PRILOSEC) 20 MG capsule Take 20 mg by mouth daily.     OVER THE COUNTER MEDICATION Apply 2 % topically 4 (four) times daily.  diltiazem hydrochloride 2% Ointment.     triamcinolone cream (KENALOG) 0.1 % PLEASE SEE ATTACHED FOR DETAILED DIRECTIONS     atorvastatin  (LIPITOR) 20 MG tablet Take 1 tablet (20 mg total) by mouth daily. 90 tablet 1    hydrocortisone  (ANUSOL -HC) 2.5 % rectal cream Place 1 Application rectally 2 (two) times daily. 30 g 0   hydrocortisone  (ANUSOL -HC) 25 MG suppository Place 1 suppository (25 mg total) rectally 2 (two) times daily. 12 suppository 0   Nitroglycerin  (RECTIV ) 0.4 % OINT Place 1 inch rectally in the morning and at bedtime. 30 g 2   No facility-administered medications prior to visit.    ROS Review of Systems  Constitutional:  Negative for appetite change, chills, diaphoresis, fatigue and fever.  HENT: Negative.    Eyes: Negative.   Respiratory:  Negative for cough, chest tightness, shortness of breath and wheezing.   Cardiovascular:  Negative for chest pain, palpitations and leg swelling.  Gastrointestinal:  Positive for rectal pain. Negative for abdominal pain, anal bleeding, blood in stool, diarrhea, nausea and vomiting.  Genitourinary: Negative.  Negative for difficulty urinating, penile swelling and scrotal swelling.  Musculoskeletal: Negative.  Negative for myalgias.  Skin: Negative.   Neurological:  Negative for dizziness and weakness.  Hematological:  Negative for adenopathy. Does not bruise/bleed easily.  Psychiatric/Behavioral: Negative.      Objective:  BP 126/76 (BP Location: Left Arm, Patient Position: Sitting, Cuff Size: Normal)   Pulse 99   Temp 98.3 F (36.8 C) (Oral)   Resp 16   Ht 5\' 8"  (1.727 m)   Wt 201 lb 12.8 oz (91.5 kg)   SpO2 96%   BMI 30.68  kg/m   BP Readings from Last 3 Encounters:  09/11/23 126/76  07/20/23 126/84  07/11/23 126/78    Wt Readings from Last 3 Encounters:  09/11/23 201 lb 12.8 oz (91.5 kg)  07/20/23 200 lb (90.7 kg)  07/11/23 201 lb 3.2 oz (91.3 kg)    Physical Exam Vitals reviewed.  Constitutional:      Appearance: Normal appearance.  HENT:     Mouth/Throat:     Mouth: Mucous membranes are moist.  Eyes:     General: No scleral icterus.    Conjunctiva/sclera: Conjunctivae normal.  Cardiovascular:     Rate and Rhythm:  Normal rate and regular rhythm.     Heart sounds: No murmur heard.    No friction rub. No gallop.  Pulmonary:     Effort: Pulmonary effort is normal.     Breath sounds: No stridor. No wheezing, rhonchi or rales.  Abdominal:     General: Abdomen is flat.     Palpations: There is no mass.     Tenderness: There is no abdominal tenderness. There is no guarding.     Hernia: No hernia is present.  Musculoskeletal:        General: Normal range of motion.     Cervical back: Neck supple.     Right lower leg: No edema.     Left lower leg: No edema.  Lymphadenopathy:     Cervical: No cervical adenopathy.  Skin:    General: Skin is warm and dry.  Neurological:     General: No focal deficit present.     Mental Status: He is alert. Mental status is at baseline.  Psychiatric:        Mood and Affect: Mood normal.        Behavior: Behavior normal.        Thought Content: Thought content normal.        Judgment: Judgment normal.     Lab Results  Component Value Date   WBC 9.2 09/11/2023   HGB 13.3 09/11/2023   HCT 39.4 09/11/2023   PLT 275.0 09/11/2023   GLUCOSE 122 (H) 09/11/2023   CHOL 178 09/11/2023   TRIG 320.0 (H) 09/11/2023   HDL 40.90 09/11/2023   LDLDIRECT 156.0 09/07/2022   LDLCALC 73 09/11/2023   ALT 21 09/11/2023   AST 19 09/11/2023   NA 141 09/11/2023   K 3.8 09/11/2023   CL 103 09/11/2023   CREATININE 1.17 09/11/2023   BUN 19 09/11/2023   CO2 28 09/11/2023   TSH 1.43 09/07/2022   PSA 0.93 09/11/2023    CT CARDIAC SCORING ([facility] LOCATIONS ONLY) Result Date: 04/08/2023 CLINICAL DATA:  53 year old Caucasian male with history of hypertension and high cholesterol. Evaluate for coronary artery disease. * Tracking Code: FCC * EXAM: CT CARDIAC CORONARY ARTERY CALCIUM  SCORE TECHNIQUE: Non-contrast imaging through the heart was performed using prospective ECG gating. Image post processing was performed on an independent workstation, allowing for quantitative analysis of the  heart and coronary arteries. Note that this exam targets the heart and the chest was not imaged in its entirety. COMPARISON:  No priors. FINDINGS: CORONARY CALCIUM  SCORES: Left Main: 0 LAD: 8 LCx: 0 RCA/PDA: 0 Total Agatston Score: 8 MESA database percentile: 54th AORTA MEASUREMENTS: Ascending Aorta: 3.1 cm Descending Aorta:2.7 cm OTHER FINDINGS: Within the visualized portions of the thorax there are no suspicious appearing pulmonary nodules or masses, there is no acute consolidative airspace disease, no pleural effusions, no pneumothorax and no lymphadenopathy. Visualized portions of  the upper abdomen are unremarkable. There are no aggressive appearing lytic or blastic lesions noted in the visualized portions of the skeleton. IMPRESSION: 1. Patient's total coronary artery calcium  score is 8 which is 54th percentile for patient's of matched age, gender and race/ethnicity. Please note that although the presence of coronary artery calcium  documents the presence of coronary artery disease, the severity of this disease and any potential stenosis cannot be assessed on this noncontrast CT examination. Assessment for potential risk factor modification, dietary therapy or pharmacologic therapy may be warranted, if clinically indicated. 2. No significant incidental noncardiac findings are noted. Electronically Signed   By: Alexandria Angel M.D.   On: 04/08/2023 08:50    Assessment & Plan:  Dyslipidemia, goal LDL below 100- LDL goal achieved. Doing well on the statin  -     Lipid panel; Future -     Hepatic function panel; Future -     Atorvastatin  Calcium ; Take 1 tablet (20 mg total) by mouth daily.  Dispense: 90 tablet; Refill: 1  Primary hypertension- BP is well controlled. -     Basic metabolic panel with GFR; Future -     Hepatic function panel; Future -     CBC with Differential/Platelet; Future  Encounter for general adult medical examination with abnormal findings- Exam completed, labs reviewed, vaccines  reviewed, cancer screenings addressed, pt ed material was given.  -     PSA; Future  Gastroesophageal reflux disease without esophagitis -     CBC with Differential/Platelet; Future     Follow-up: Return in about 6 months (around 03/12/2024).  Sandra Crouch, MD

## 2023-09-11 NOTE — Patient Instructions (Signed)
 Health Maintenance, Male  Adopting a healthy lifestyle and getting preventive care are important in promoting health and wellness. Ask your health care provider about:  The right schedule for you to have regular tests and exams.  Things you can do on your own to prevent diseases and keep yourself healthy.  What should I know about diet, weight, and exercise?  Eat a healthy diet    Eat a diet that includes plenty of vegetables, fruits, low-fat dairy products, and lean protein.  Do not eat a lot of foods that are high in solid fats, added sugars, or sodium.  Maintain a healthy weight  Body mass index (BMI) is a measurement that can be used to identify possible weight problems. It estimates body fat based on height and weight. Your health care provider can help determine your BMI and help you achieve or maintain a healthy weight.  Get regular exercise  Get regular exercise. This is one of the most important things you can do for your health. Most adults should:  Exercise for at least 150 minutes each week. The exercise should increase your heart rate and make you sweat (moderate-intensity exercise).  Do strengthening exercises at least twice a week. This is in addition to the moderate-intensity exercise.  Spend less time sitting. Even light physical activity can be beneficial.  Watch cholesterol and blood lipids  Have your blood tested for lipids and cholesterol at 55 years of age, then have this test every 5 years.  You may need to have your cholesterol levels checked more often if:  Your lipid or cholesterol levels are high.  You are older than 55 years of age.  You are at high risk for heart disease.  What should I know about cancer screening?  Many types of cancers can be detected early and may often be prevented. Depending on your health history and family history, you may need to have cancer screening at various ages. This may include screening for:  Colorectal cancer.  Prostate cancer.  Skin cancer.  Lung  cancer.  What should I know about heart disease, diabetes, and high blood pressure?  Blood pressure and heart disease  High blood pressure causes heart disease and increases the risk of stroke. This is more likely to develop in people who have high blood pressure readings or are overweight.  Talk with your health care provider about your target blood pressure readings.  Have your blood pressure checked:  Every 3-5 years if you are 9-95 years of age.  Every year if you are 85 years old or older.  If you are between the ages of 29 and 29 and are a current or former smoker, ask your health care provider if you should have a one-time screening for abdominal aortic aneurysm (AAA).  Diabetes  Have regular diabetes screenings. This checks your fasting blood sugar level. Have the screening done:  Once every three years after age 23 if you are at a normal weight and have a low risk for diabetes.  More often and at a younger age if you are overweight or have a high risk for diabetes.  What should I know about preventing infection?  Hepatitis B  If you have a higher risk for hepatitis B, you should be screened for this virus. Talk with your health care provider to find out if you are at risk for hepatitis B infection.  Hepatitis C  Blood testing is recommended for:  Everyone born from [birth year] through 1967.  Anyone  with known risk factors for hepatitis C.  Sexually transmitted infections (STIs)  You should be screened each year for STIs, including gonorrhea and chlamydia, if:  You are sexually active and are younger than 55 years of age.  You are older than 55 years of age and your health care provider tells you that you are at risk for this type of infection.  Your sexual activity has changed since you were last screened, and you are at increased risk for chlamydia or gonorrhea. Ask your health care provider if you are at risk.  Ask your health care provider about whether you are at high risk for HIV. Your health care provider  may recommend a prescription medicine to help prevent HIV infection. If you choose to take medicine to prevent HIV, you should first get tested for HIV. You should then be tested every 3 months for as long as you are taking the medicine.  Follow these instructions at home:  Alcohol use  Do not drink alcohol if your health care provider tells you not to drink.  If you drink alcohol:  Limit how much you have to 0-2 drinks a day.  Know how much alcohol is in your drink. In the [place] one drink equals one 12 oz bottle of beer (355 mL), one 5 oz glass of wine (148 mL), or one 1 oz glass of hard liquor (44 mL).  Lifestyle  Do not use any products that contain nicotine or tobacco. These products include cigarettes, chewing tobacco, and vaping devices, such as e-cigarettes. If you need help quitting, ask your health care provider.  Do not use street drugs.  Do not share needles.  Ask your health care provider for help if you need support or information about quitting drugs.  General instructions  Schedule regular health, dental, and eye exams.  Stay current with your vaccines.  Tell your health care provider if:  You often feel depressed.  You have ever been abused or do not feel safe at home.  Summary  Adopting a healthy lifestyle and getting preventive care are important in promoting health and wellness.  Follow your health care provider's instructions about healthy diet, exercising, and getting tested or screened for diseases.  Follow your health care provider's instructions on monitoring your cholesterol and blood pressure.  This information is not intended to replace advice given to you by your health care provider. Make sure you discuss any questions you have with your health care provider.  Document Revised: 08/17/2020 Document Reviewed: 08/17/2020  Elsevier Patient Education  2024 [employer].

## 2023-09-12 ENCOUNTER — Ambulatory Visit: Payer: Self-pay | Admitting: Internal Medicine

## 2023-09-14 MED ORDER — ATORVASTATIN CALCIUM 20 MG PO TABS: 20.0000 mg | ORAL_TABLET | Freq: Every day | ORAL | 1 refills | Status: DC

## 2023-10-18 ENCOUNTER — Telehealth: Payer: Self-pay | Admitting: Neurology

## 2023-10-18 NOTE — Progress Notes (Deleted)
 Patient: Adam [name] Date of Birth: April 19, 1970  Reason for Visit: Follow up History from: Patient Primary Neurologist: Buck   Virtual Visit via Video Note  I connected with Adam [name] on 10/18/23 at  1:45 PM EDT by a video enabled telemedicine application and verified that I am speaking with the correct person using two identifiers.  Location: Patient: *** Provider: ***   I discussed the limitations of evaluation and management by telemedicine and the availability of in person appointments. The patient expressed understanding and agreed to proceed.  ASSESSMENT AND PLAN 53 y.o. year old male   1.  OSA on CPAP (sleep study 09/16/21 which showed overall mild OSA with total AHI 9.4/h although severe in rem sleep with a REM AHI of 40.3/h.  Started AutoPap 11/10/21)  -Commended on superb CPAP compliance, continue nightly use for minimum of 4 hours. -Will check with insurance to see if any other DME companies are options -Continue current settings, send supplies as needed -Follow-up in 1 year virtually or sooner if needed  HISTORY OF PRESENT ILLNESS: Today 10/18/23 Update 10/18/23 SS: Here for VV for CPAP.  100% compliance with CPAP 5 to 12 cm water, leak 3.4, AHI 1.0.  Update 10/12/22 SS: Here today for CPAP visit.  Has superb compliance 09/10/2022-10/09/2022 100% greater than 4-hour nightly usage.  Average usage 7 hours 3 minutes.  Pressure 5-12 cm water.  Leak 5.5, AHI 1.2. using nasal mask, notices it tends to leak when wearing out. Has not made a huge difference, but BP has improved. Is less sleepy, not as tired in the evenings, can get more accomplished. Would be interested in a new DME if insurance will allow.  ESS 8.  HISTORY  Update 12/23/2021 JM: Patient is being seen for initial CPAP compliance visit.  Completed sleep study 6/8 which showed overall mild OSA with total AHI 9.4/h although severe in rem sleep with a REM AHI of 40.3/h.  Recommended initiating AutoPap which was started on  8/2.    Review of compliance report below shows excellent usage and optimal residual AHI.  Reports some improvement of fatigue but still feels quite sleepy upon awakening.  Thankfully, he has not had any further issues with headaches.  Unsure if he is still snoring as he sleeps alone.  He does have some issues with mask leaks particularly when laying on side.  He has not tried other mask options.  Epworth Sleepiness Scale 9/24 (prior to CPAP 12/24).  Fatigue severity scale 25/63 (39/63).                  Consult visit 08/04/2021 Dr. Buck: Mr. Dimmick is a 53 year old right-handed gentleman with an underlying medical history of reflux disease, hiatal hernia, kidney stones, anxiety, irritable bowel syndrome, hypertension and borderline obesity, who reports snoring and excessive daytime somnolence as well as recurrent headaches.  As well as difficulty initiating sleep.  I reviewed your office note from 06/02/2021.  He was started losartan and his blood pressure has improved for the most part and his recurrent headaches have actually improved quite a bit from nearly daily to about twice a week.  He does occasionally wake up with a headache.  He denies night to night nocturia.  His mom has sleep apnea and has a CPAP machine.  He would consider treatment with a CPAP if necessary.  His Epworth sleepiness score is 12 out of 24, fatigue severity score is 39 out of 63.  He is single and  lives alone.  He has no children.  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  He does have stress at work.  He works typically from 8:30 AM to 5 PM or thereabouts.  Bedtime is generally around 11 PM and rise time around 7 AM.  He does drink quite a bit of caffeine in the form of soda, typically between 4 and 6 cans/day.  He is a non-smoker and drinks alcohol occasionally, 2-3 drinks per week.  Weight has been fluctuating, generally within 10 pounds.  REVIEW OF SYSTEMS: Out of a  complete 14 system review of symptoms, the patient complains only of the following symptoms, and all other reviewed systems are negative.  See HPI  ALLERGIES: Allergies  Allergen Reactions   Iodinated Contrast Media Hives, Swelling and Anaphylaxis   Duloxetine Hcl     Other Reaction(s): drowsy   Fluoxetine     Other Reaction(s): zombie, sexual dysfunction   Morphine     Other Reaction(s): HA, nausea    HOME MEDICATIONS: Outpatient Medications Prior to Visit  Medication Sig Dispense Refill   atorvastatin  (LIPITOR) 20 MG tablet Take 1 tablet (20 mg total) by mouth daily. 90 tablet 1   loperamide (IMODIUM) 2 MG capsule Take 8 mg by mouth daily.     loratadine (CLARITIN) 10 MG tablet Take 10 mg by mouth daily.     Multiple Vitamin (MULTIVITAMIN PO) Take by mouth.     naproxen sodium (ALEVE) 220 MG tablet Take 440 mg by mouth daily.     olmesartan  (BENICAR ) 20 MG tablet Take 1 tablet (20 mg total) by mouth daily. 90 tablet 1   omeprazole (PRILOSEC) 20 MG capsule Take 20 mg by mouth daily.     OVER THE COUNTER MEDICATION Apply 2 % topically 4 (four) times daily.  diltiazem hydrochloride 2% Ointment.     triamcinolone cream (KENALOG) 0.1 % PLEASE SEE ATTACHED FOR DETAILED DIRECTIONS     No facility-administered medications prior to visit.    PAST MEDICAL HISTORY: Past Medical History:  Diagnosis Date   Anxiety    GERD (gastroesophageal reflux disease)    Hiatal hernia    Kidney stones     PAST SURGICAL HISTORY: No past surgical history on file.  FAMILY HISTORY: Family History  Problem Relation Age of Onset   Sleep apnea Mother    Hypertension Father     SOCIAL HISTORY: Social History   Socioeconomic History   Marital status: Single    Spouse name: Not on file   Number of children: Not on file   Years of education: Not on file   Highest education level: Bachelor's degree (e.g., BA, AB, BS)  Occupational History   Occupation: REGISTERED NURSE    Employer: [employer]  Tobacco Use   Smoking status: Never   Smokeless tobacco: Never  Vaping Use   Vaping status: Never Used  Substance and Sexual Activity   Alcohol use: Not Currently    Alcohol/week: 3.0 standard drinks of alcohol    Types: 3 Standard drinks or equivalent per week   Drug use: Never   Sexual activity: Yes    Partners: Male    Birth control/protection: None  Other Topics Concern   Not on file  Social History Narrative   Not on file   Social Drivers of Health   Financial Resource Strain: Not on file  Food Insecurity: Not on file  Transportation Needs: Not on file  Physical Activity: Not on file  Stress: Not on file  Social Connections: Not on file  Intimate Partner Violence: Not on file    PHYSICAL EXAM  There were no vitals filed for this visit.  There is no height or weight on file to calculate BMI.  Generalized: Well developed, in no acute distress  Neurological examination  Mentation: Alert oriented to time, place, history taking. Follows all commands speech and language fluent Cranial nerve II-XII: Pupils were equal round reactive to light. Extraocular movements were full, visual field were full on confrontational test. Facial sensation and strength were normal.  Head turning and shoulder shrug  were normal and symmetric. Motor: The motor testing reveals 5 over 5 strength of all 4 extremities. Good symmetric motor tone is noted throughout.  Sensory: Sensory testing is intact to soft touch on all 4 extremities. No evidence of extinction is noted.  Coordination: Cerebellar testing reveals good finger-nose-finger .  Gait and station: Gait is normal.   DIAGNOSTIC DATA (LABS, IMAGING, TESTING) - I reviewed patient records, labs, notes, testing and imaging myself where available.  Lab Results  Component Value Date   WBC 9.2 09/11/2023   HGB 13.3 09/11/2023   HCT 39.4 09/11/2023   MCV 89.2 09/11/2023   PLT 275.0 09/11/2023       Component Value Date/Time   NA 141 09/11/2023 1608   K 3.8 09/11/2023 1608   CL 103 09/11/2023 1608   CO2 28 09/11/2023 1608   GLUCOSE 122 (H) 09/11/2023 1608   BUN 19 09/11/2023 1608   CREATININE 1.17 09/11/2023 1608   CREATININE 0.84 05/28/2019 1610   CALCIUM  9.4 09/11/2023 1608   PROT 7.5 09/11/2023 1608   ALBUMIN 4.5 09/11/2023 1608   AST 19 09/11/2023 1608   ALT 21 09/11/2023 1608   ALKPHOS 78 09/11/2023 1608   BILITOT 0.4 09/11/2023 1608   GFRNONAA 102 05/28/2019 1610   GFRAA 118 05/28/2019 1610   Lab Results  Component Value Date   CHOL 178 09/11/2023   HDL 40.90 09/11/2023   LDLCALC 73 09/11/2023   LDLDIRECT 156.0 09/07/2022   TRIG 320.0 (H) 09/11/2023   CHOLHDL 4 09/11/2023   No results found for: HGBA1C No results found for: [employer] Lab Results  Component Value Date   TSH 1.43 09/07/2022    Lauraine Born, AGNP-C, DNP 10/18/2023, 5:46 AM [facility] [address], [address] [place], [place] [address] [phone]

## 2024-01-05 ENCOUNTER — Encounter: Payer: Self-pay | Admitting: Internal Medicine

## 2024-01-09 ENCOUNTER — Other Ambulatory Visit: Payer: Self-pay | Admitting: Internal Medicine

## 2024-01-09 DIAGNOSIS — B353 Tinea pedis: Secondary | ICD-10-CM

## 2024-01-09 MED ORDER — TERBINAFINE HCL 250 MG PO TABS: 250.0000 mg | ORAL_TABLET | Freq: Every day | ORAL | 0 refills | Status: AC

## 2024-01-24 ENCOUNTER — Other Ambulatory Visit: Payer: Self-pay | Admitting: Internal Medicine

## 2024-01-24 DIAGNOSIS — I10 Essential (primary) hypertension: Secondary | ICD-10-CM

## 2024-01-29 ENCOUNTER — Other Ambulatory Visit: Payer: Self-pay | Admitting: Internal Medicine

## 2024-01-29 DIAGNOSIS — B353 Tinea pedis: Secondary | ICD-10-CM | POA: Insufficient documentation

## 2024-01-29 MED ORDER — KETOCONAZOLE 200 MG PO TABS
200.0000 mg | ORAL_TABLET | Freq: Every day | ORAL | 0 refills | Status: AC
Start: 2024-01-29 — End: 2024-02-12

## 2024-03-12 ENCOUNTER — Encounter: Payer: Self-pay | Admitting: Internal Medicine

## 2024-03-12 ENCOUNTER — Ambulatory Visit (INDEPENDENT_AMBULATORY_CARE_PROVIDER_SITE_OTHER): Payer: PRIVATE HEALTH INSURANCE | Admitting: Internal Medicine

## 2024-03-12 VITALS — BP 130/78 | HR 98 | Temp 98.7°F | Resp 16 | Ht 68.0 in | Wt 206.8 lb

## 2024-03-12 DIAGNOSIS — E781 Pure hyperglyceridemia: Secondary | ICD-10-CM | POA: Insufficient documentation

## 2024-03-12 DIAGNOSIS — I10 Essential (primary) hypertension: Secondary | ICD-10-CM

## 2024-03-12 DIAGNOSIS — E785 Hyperlipidemia, unspecified: Secondary | ICD-10-CM

## 2024-03-12 DIAGNOSIS — M150 Primary generalized (osteo)arthritis: Secondary | ICD-10-CM | POA: Insufficient documentation

## 2024-03-12 LAB — CBC WITH DIFFERENTIAL/PLATELET
Basophils Absolute: 0 K/uL (ref 0.0–0.1)
Basophils Relative: 0.4 % (ref 0.0–3.0)
Eosinophils Absolute: 0.2 K/uL (ref 0.0–0.7)
Eosinophils Relative: 2.4 % (ref 0.0–5.0)
HCT: 39.3 % (ref 39.0–52.0)
Hemoglobin: 13.3 g/dL (ref 13.0–17.0)
Lymphocytes Relative: 25.4 % (ref 12.0–46.0)
Lymphs Abs: 2.4 K/uL (ref 0.7–4.0)
MCHC: 33.9 g/dL (ref 30.0–36.0)
MCV: 88.8 fl (ref 78.0–100.0)
Monocytes Absolute: 0.9 K/uL (ref 0.1–1.0)
Monocytes Relative: 9.3 % (ref 3.0–12.0)
Neutro Abs: 6 K/uL (ref 1.4–7.7)
Neutrophils Relative %: 62.5 % (ref 43.0–77.0)
Platelets: 266 K/uL (ref 150.0–400.0)
RBC: 4.42 Mil/uL (ref 4.22–5.81)
RDW: 12.9 % (ref 11.5–15.5)
WBC: 9.6 K/uL (ref 4.0–10.5)

## 2024-03-12 LAB — BASIC METABOLIC PANEL WITH GFR
BUN: 16 mg/dL (ref 6–23)
CO2: 30 meq/L (ref 19–32)
Calcium: 9.7 mg/dL (ref 8.4–10.5)
Chloride: 103 meq/L (ref 96–112)
Creatinine, Ser: 0.97 mg/dL (ref 0.40–1.50)
GFR: 87.75 mL/min (ref >60.00)
Glucose, Bld: 97 mg/dL (ref 70–99)
Potassium: 3.9 meq/L (ref 3.5–5.1)
Sodium: 139 meq/L (ref 135–145)

## 2024-03-12 LAB — TSH: TSH: 1.3 u[IU]/mL (ref 0.35–5.50)

## 2024-03-12 LAB — CK: Total CK: 125 U/L (ref 17–232)

## 2024-03-12 LAB — TRIGLYCERIDES: Triglycerides: 356 mg/dL — ABNORMAL HIGH (ref 0.0–149.0)

## 2024-03-12 MED ORDER — CELECOXIB 100 MG PO CAPS: 100.0000 mg | ORAL_CAPSULE | Freq: Every day | ORAL | 1 refills | Status: AC

## 2024-03-12 MED ORDER — COVID-19 MRNA VAC-TRIS(PFIZER) 30 MCG/0.3ML IM SUSY: 0.3000 mL | PREFILLED_SYRINGE | Freq: Once | INTRAMUSCULAR | 0 refills | Status: AC

## 2024-03-12 NOTE — Patient Instructions (Signed)

## 2024-03-12 NOTE — Progress Notes (Unsigned)
 Subjective:  Patient ID: Adam [name], male    DOB: 1970-08-15  Age: 53 y.o. MRN: [id number]  CC: Dyslipidemia, Osteoarthritis, and Hypertension   HPI Adam [name] presents for f/up ---  Discussed the use of AI scribe software for clinical note transcription with the patient, who gave verbal consent to proceed.  History of Present Illness Adam [name] is a 53 year old male who presents with worsening joint pain.  He has been experiencing worsening joint pain, which has intensified over time. The pain affects his joints rather than muscles and interferes with his ability to sleep, often keeping him awake due to discomfort. He manages the pain with two Tylenol at bedtime and occasionally takes Benadryl to aid sleep. Additionally, he takes two Aleve daily but does not notice significant relief, particularly when standing up or sitting down. The pain is more noticeable at night when trying to sleep, although he remains busy during the day, which may distract from the discomfort.  No trouble swallowing, blood pressure symptoms, headaches, blurred vision, chest pain, or shortness of breath. He has a history of IBS, characterized by diarrhea, but it has not been more problematic than usual recently. He manages his IBS with daily medication.    Outpatient Medications Prior to Visit  Medication Sig Dispense Refill   Carboxymethylcellulose Sodium (ARTIFICIAL TEARS OP) Apply to eye in the morning, at noon, in the evening, and at bedtime.     loperamide (IMODIUM) 2 MG capsule Take 8 mg by mouth daily.     loratadine (CLARITIN) 10 MG tablet Take 10 mg by mouth daily.     Multiple Vitamin (MULTIVITAMIN PO) Take by mouth.     olmesartan  (BENICAR ) 20 MG tablet TAKE 1 TABLET BY MOUTH EVERY DAY 30 tablet 5   omeprazole (PRILOSEC) 20 MG capsule Take 20 mg by mouth daily.     OVER THE COUNTER MEDICATION Apply 2 % topically 4 (four) times daily.  diltiazem hydrochloride 2% Ointment.     triamcinolone cream  (KENALOG) 0.1 % PLEASE SEE ATTACHED FOR DETAILED DIRECTIONS     atorvastatin  (LIPITOR) 20 MG tablet Take 1 tablet (20 mg total) by mouth daily. 90 tablet 1   naproxen sodium (ALEVE) 220 MG tablet Take 440 mg by mouth daily.     No facility-administered medications prior to visit.    ROS Review of Systems  Constitutional:  Negative for appetite change, chills, diaphoresis, fatigue and fever.  HENT: Negative.    Eyes: Negative.   Respiratory: Negative.  Negative for chest tightness, shortness of breath and wheezing.   Cardiovascular:  Negative for chest pain, palpitations and leg swelling.  Gastrointestinal: Negative.  Negative for abdominal pain, constipation, diarrhea, nausea and vomiting.  Genitourinary: Negative.  Negative for difficulty urinating and dysuria.  Musculoskeletal:  Positive for arthralgias. Negative for myalgias.  Skin: Negative.   Neurological: Negative.  Negative for dizziness.  Hematological:  Negative for adenopathy. Does not bruise/bleed easily.  Psychiatric/Behavioral:  Positive for sleep disturbance. Negative for confusion and decreased concentration. The patient is not nervous/anxious.     Objective:  BP 130/78 (BP Location: Left Arm, Patient Position: Sitting, Cuff Size: Normal)   Pulse 98   Temp 98.7 F (37.1 C) (Oral)   Resp 16   Ht 5' 8 (1.727 m)   Wt 206 lb 12.8 oz (93.8 kg)   SpO2 95%   BMI 31.44 kg/m   BP Readings from Last 3 Encounters:  03/12/24 130/78  09/11/23 126/76  07/20/23 126/84    Wt Readings from Last 3 Encounters:  03/12/24 206 lb 12.8 oz (93.8 kg)  09/11/23 201 lb 12.8 oz (91.5 kg)  07/20/23 200 lb (90.7 kg)    Physical Exam Vitals reviewed.  Constitutional:      Appearance: Normal appearance.  HENT:     Nose: Nose normal.     Mouth/Throat:     Mouth: Mucous membranes are moist.  Eyes:     General: No scleral icterus.    Conjunctiva/sclera: Conjunctivae normal.  Cardiovascular:     Rate and Rhythm: Normal rate and  regular rhythm.     Heart sounds: No murmur heard.    No friction rub. No gallop.  Pulmonary:     Effort: Pulmonary effort is normal.     Breath sounds: No stridor. No wheezing, rhonchi or rales.  Abdominal:     General: Abdomen is flat.     Palpations: There is no mass.     Tenderness: There is no abdominal tenderness. There is no guarding.     Hernia: No hernia is present.  Musculoskeletal:        General: Normal range of motion.     Cervical back: Neck supple.     Right lower leg: No edema.     Left lower leg: No edema.  Lymphadenopathy:     Cervical: No cervical adenopathy.  Skin:    General: Skin is warm and dry.  Neurological:     General: No focal deficit present.     Mental Status: He is alert.  Psychiatric:        Mood and Affect: Mood normal.        Behavior: Behavior normal.     Lab Results  Component Value Date   WBC 9.6 03/12/2024   HGB 13.3 03/12/2024   HCT 39.3 03/12/2024   PLT 266.0 03/12/2024   GLUCOSE 97 03/12/2024   CHOL 178 09/11/2023   TRIG 356.0 (H) 03/12/2024   HDL 40.90 09/11/2023   LDLDIRECT 156.0 09/07/2022   LDLCALC 73 09/11/2023   ALT 21 09/11/2023   AST 19 09/11/2023   NA 139 03/12/2024   K 3.9 03/12/2024   CL 103 03/12/2024   CREATININE 0.97 03/12/2024   BUN 16 03/12/2024   CO2 30 03/12/2024   TSH 1.30 03/12/2024   PSA 0.93 09/11/2023    CT CARDIAC SCORING ([facility] LOCATIONS ONLY) Result Date: 04/08/2023 CLINICAL DATA:  53 year old Caucasian male with history of hypertension and high cholesterol. Evaluate for coronary artery disease. * Tracking Code: FCC * EXAM: CT CARDIAC CORONARY ARTERY CALCIUM  SCORE TECHNIQUE: Non-contrast imaging through the heart was performed using prospective ECG gating. Image post processing was performed on an independent workstation, allowing for quantitative analysis of the heart and coronary arteries. Note that this exam targets the heart and the chest was not imaged in its entirety. COMPARISON:  No priors.  FINDINGS: CORONARY CALCIUM  SCORES: Left Main: 0 LAD: 8 LCx: 0 RCA/PDA: 0 Total Agatston Score: 8 MESA database percentile: 54th AORTA MEASUREMENTS: Ascending Aorta: 3.1 cm Descending Aorta:2.7 cm OTHER FINDINGS: Within the visualized portions of the thorax there are no suspicious appearing pulmonary nodules or masses, there is no acute consolidative airspace disease, no pleural effusions, no pneumothorax and no lymphadenopathy. Visualized portions of the upper abdomen are unremarkable. There are no aggressive appearing lytic or blastic lesions noted in the visualized portions of the skeleton. IMPRESSION: 1. Patient's total coronary artery calcium  score is 8 which is 54th percentile for  patient's of matched age, gender and race/ethnicity. Please note that although the presence of coronary artery calcium  documents the presence of coronary artery disease, the severity of this disease and any potential stenosis cannot be assessed on this noncontrast CT examination. Assessment for potential risk factor modification, dietary therapy or pharmacologic therapy may be warranted, if clinically indicated. 2. No significant incidental noncardiac findings are noted. Electronically Signed   By: Toribio Aye M.D.   On: 04/08/2023 08:50    Assessment & Plan:   Primary hypertension- BP is well controlled. -     TSH; Future -     CBC with Differential/Platelet; Future -     Basic metabolic panel with GFR; Future -     COVID-19 mRNA Vac-TriS(Pfizer); Inject 0.3 mLs into the muscle once for 1 dose.  Dispense: 0.3 mL; Refill: 0  Pure hyperglyceridemia -     Triglycerides; Future -     CK; Future  Dyslipidemia, goal LDL below 100- LDL goal achieved. Doing well on the statin  -     TSH; Future -     CK; Future -     Atorvastatin  Calcium ; Take 1 tablet (20 mg total) by mouth daily.  Dispense: 90 tablet; Refill: 1  Primary osteoarthritis involving multiple joints -     Celecoxib; Take 1 capsule (100 mg total) by  mouth daily.  Dispense: 90 capsule; Refill: 1     Follow-up: Return in about 6 months (around 09/10/2024).  Debby Molt, MD

## 2024-03-13 ENCOUNTER — Ambulatory Visit: Payer: Self-pay | Admitting: Internal Medicine

## 2024-03-14 MED ORDER — ATORVASTATIN CALCIUM 20 MG PO TABS: 20.0000 mg | ORAL_TABLET | Freq: Every day | ORAL | 1 refills | Status: AC

## 2024-09-10 ENCOUNTER — Ambulatory Visit: Payer: PRIVATE HEALTH INSURANCE | Admitting: Internal Medicine
